# Patient Record
Sex: Male | Born: 1935 | Race: White | Hispanic: No | Marital: Married | Smoking: Current every day smoker
Health system: Southern US, Community
[De-identification: ages and names within clinical notes are randomized; demographics above are authoritative.]

## PROBLEM LIST (undated history)

## (undated) DIAGNOSIS — I6529 Occlusion and stenosis of unspecified carotid artery: Secondary | ICD-10-CM

## (undated) DIAGNOSIS — I714 Abdominal aortic aneurysm, without rupture, unspecified: Secondary | ICD-10-CM

## (undated) DIAGNOSIS — E7800 Pure hypercholesterolemia, unspecified: Secondary | ICD-10-CM

## (undated) DIAGNOSIS — I10 Essential (primary) hypertension: Secondary | ICD-10-CM

## (undated) DIAGNOSIS — K219 Gastro-esophageal reflux disease without esophagitis: Secondary | ICD-10-CM

## (undated) DIAGNOSIS — I719 Aortic aneurysm of unspecified site, without rupture: Secondary | ICD-10-CM

## (undated) HISTORY — PX: ORTHOPEDIC SURGERY: SHX850

## (undated) HISTORY — DX: Abdominal aortic aneurysm, without rupture: I71.4

## (undated) HISTORY — DX: Occlusion and stenosis of unspecified carotid artery: I65.29

## (undated) HISTORY — PX: HERNIA REPAIR: SHX51

## (undated) HISTORY — DX: Essential (primary) hypertension: I10

## (undated) HISTORY — DX: Abdominal aortic aneurysm, without rupture, unspecified: I71.40

## (undated) HISTORY — DX: Gastro-esophageal reflux disease without esophagitis: K21.9

---

## 1998-04-13 ENCOUNTER — Encounter: Payer: Self-pay | Admitting: Emergency Medicine

## 1998-04-13 ENCOUNTER — Emergency Department (HOSPITAL_COMMUNITY): Admission: EM | Admit: 1998-04-13 | Discharge: 1998-04-13 | Payer: Self-pay | Admitting: Emergency Medicine

## 1999-02-20 ENCOUNTER — Ambulatory Visit (HOSPITAL_COMMUNITY): Admission: RE | Admit: 1999-02-20 | Discharge: 1999-02-21 | Payer: Self-pay | Admitting: Urology

## 2000-06-08 ENCOUNTER — Encounter (INDEPENDENT_AMBULATORY_CARE_PROVIDER_SITE_OTHER): Payer: Self-pay

## 2000-06-08 ENCOUNTER — Other Ambulatory Visit: Admission: RE | Admit: 2000-06-08 | Discharge: 2000-06-08 | Payer: Self-pay | Admitting: Gastroenterology

## 2000-06-13 ENCOUNTER — Ambulatory Visit (HOSPITAL_COMMUNITY): Admission: RE | Admit: 2000-06-13 | Discharge: 2000-06-13 | Payer: Self-pay | Admitting: Gastroenterology

## 2000-06-13 ENCOUNTER — Encounter: Payer: Self-pay | Admitting: Gastroenterology

## 2000-09-13 ENCOUNTER — Emergency Department (HOSPITAL_COMMUNITY): Admission: EM | Admit: 2000-09-13 | Discharge: 2000-09-13 | Payer: Self-pay | Admitting: *Deleted

## 2000-09-13 ENCOUNTER — Encounter: Payer: Self-pay | Admitting: Neurology

## 2000-09-13 ENCOUNTER — Ambulatory Visit (HOSPITAL_COMMUNITY): Admission: RE | Admit: 2000-09-13 | Discharge: 2000-09-13 | Payer: Self-pay | Admitting: Neurology

## 2001-03-03 ENCOUNTER — Ambulatory Visit (HOSPITAL_COMMUNITY): Admission: RE | Admit: 2001-03-03 | Discharge: 2001-03-03 | Payer: Self-pay | Admitting: Family Medicine

## 2001-03-03 ENCOUNTER — Encounter: Payer: Self-pay | Admitting: Family Medicine

## 2003-10-25 ENCOUNTER — Inpatient Hospital Stay (HOSPITAL_COMMUNITY): Admission: EM | Admit: 2003-10-25 | Discharge: 2003-10-27 | Payer: Self-pay | Admitting: Emergency Medicine

## 2003-10-27 ENCOUNTER — Encounter: Payer: Self-pay | Admitting: Internal Medicine

## 2003-12-19 ENCOUNTER — Ambulatory Visit (HOSPITAL_COMMUNITY): Admission: RE | Admit: 2003-12-19 | Discharge: 2003-12-19 | Payer: Self-pay | Admitting: Gastroenterology

## 2004-03-23 ENCOUNTER — Ambulatory Visit (HOSPITAL_COMMUNITY): Admission: RE | Admit: 2004-03-23 | Discharge: 2004-03-23 | Payer: Self-pay | Admitting: Family Medicine

## 2004-06-15 ENCOUNTER — Ambulatory Visit (HOSPITAL_COMMUNITY): Admission: RE | Admit: 2004-06-15 | Discharge: 2004-06-15 | Payer: Self-pay | Admitting: Family Medicine

## 2006-09-24 ENCOUNTER — Encounter: Admission: RE | Admit: 2006-09-24 | Discharge: 2006-09-24 | Payer: Self-pay | Admitting: Family Medicine

## 2008-01-31 ENCOUNTER — Encounter: Admission: RE | Admit: 2008-01-31 | Discharge: 2008-01-31 | Payer: Self-pay | Admitting: General Surgery

## 2008-02-01 ENCOUNTER — Ambulatory Visit (HOSPITAL_BASED_OUTPATIENT_CLINIC_OR_DEPARTMENT_OTHER): Admission: RE | Admit: 2008-02-01 | Discharge: 2008-02-01 | Payer: Self-pay | Admitting: General Surgery

## 2009-01-02 ENCOUNTER — Encounter: Admission: RE | Admit: 2009-01-02 | Discharge: 2009-01-02 | Payer: Self-pay | Admitting: Family Medicine

## 2009-04-20 ENCOUNTER — Encounter: Admission: RE | Admit: 2009-04-20 | Discharge: 2009-04-20 | Payer: Self-pay | Admitting: Orthopedic Surgery

## 2010-06-02 ENCOUNTER — Other Ambulatory Visit: Payer: Self-pay | Admitting: Dermatology

## 2010-09-29 NOTE — Op Note (Signed)
NAMEJULIS, [name]                 ACCOUNT NO.:  [id number]   MEDICAL RECORD NO.:  [id number]          PATIENT TYPE:  AMB   LOCATION:  DSC                          FACILITY:  [facility]   PHYSICIAN:  Adolph Pollack, M.D.DATE OF BIRTH:  August 28, 1933   DATE OF PROCEDURE:  02/01/2008  DATE OF DISCHARGE:                               OPERATIVE REPORT   PREOPERATIVE DIAGNOSIS:  Right inguinal hernia.   POSTOPERATIVE DIAGNOSIS:  Direct right inguinal hernia.   PROCEDURE:  Right inguinal hernia repair with mesh.   SURGEON:  Adolph Pollack, MD   ASSISTANT:  Tomma Rakers, PA student   ANESTHESIA:  General/LMA with Marcaine local.   INDICATIONS:  This is a 77 year old male who has been developing some  groin pain on the right side.  He has been known to have a right  inguinal hernia for a couple years but it has become bothering him more  and more and thus he presents for repair.  The procedure and risks and  aftercare were discussed with him preoperatively.   TECHNIQUE:  He was seen in the holding area and the right groin marked  with my initials.  He was brought to the operating room and placed  supine on the operating table and a general anesthetic was administered  by way of LMA.  The hair in the right groin was clipped and the right  groin area was sterilely prepped and draped.  Local anesthetic was  infiltrated superficially and deep in the right groin.  A right groin  incision was made through the skin, subcutaneous tissue, and Scarpa  fascia exposing the external oblique aponeurosis.  Local anesthetic was  infiltrated deep to the external oblique aponeurosis.  The external  oblique aponeurosis was then incised through the external ring medially  and towards the anterior superior iliac spine.  Using blunt dissection,  I exposed the shelving edge of the inguinal ligament inferiorly and the  internal oblique muscle aponeurosis superiorly.  The ilioinguinal nerve  was identified and  a perineural block was performed.  The spermatic cord  was then isolated and a window created around using blunt dissection and  I included the ilioinguinal nerve with this.  I noted that there was no  indirect sac, but there was a direct hernia that I was able to reduce.  Following this, a piece of 3 x 6-inch polypropylene mesh was brought  into the field and anchored 2 cm medial to the pubic tubercle.  This was  done with a 2-0 Prolene suture.  The inferior aspect of mesh was then  anchored to the shelving edge of the inguinal ligament with a running 2-  0 Prolene suture upto the level 1-2 cm lateral to the internal ring.  A  slit was cut into the mesh creating 2 tails which were wrapped around  the cord.  The superior aspect of the mesh was anchored to the internal  oblique aponeurosis with interrupted 2-0 Vicryl sutures.  Following  this, the 2 tails of the mesh were crossed creating a new internal ring  and they  were then anchored to the shelving edge of the inguinal  ligament with a 2-0 Prolene suture.  The tip of a hemostat was able to  be placed through the new aperture.   At this point, hemostasis was adequate.  The lateral aspect of mesh was  then tucked deep to the external oblique aponeurosis.  The external  oblique aponeurosis was closed over the mesh and cord with a running 3-0  Vicryl suture.  Subcutaneous tissue was closed with a running 2-0 Vicryl  suture.  The skin was closed with running 3-0 Monocryl subcuticular  stitch.  Steri-Strips and sterile dressings were applied.   He tolerated the procedure well without any apparent complications and  was taken to recovery room in satisfactory condition.      Adolph Pollack, M.D.  Electronically Signed     TJR/MEDQ  D:  02/01/2008  T:  02/02/2008  Job:  [id number]   cc:   Dario Guardian, M.D.

## 2010-10-02 NOTE — Op Note (Signed)
NAME:  [name], [name]                           ACCOUNT NO.:  [id number]   MEDICAL RECORD NO.:  [id number]                   PATIENT TYPE:  AMB   LOCATION:  ENDO                                 FACILITY:  [facility]   PHYSICIAN:  James L. Malon Kindle., M.D.          DATE OF BIRTH:  1934/02/12   DATE OF PROCEDURE:  12/19/2003  DATE OF DISCHARGE:                                 OPERATIVE REPORT   PROCEDURE:  Esophagogastroduodenoscopy.   MEDICATIONS GIVEN:  Cetacaine spray, Fentanyl 50 mcg, Versed 5 mg IV.   INDICATIONS FOR PROCEDURE:  The patient has had esophageal reflux symptoms  that have been refractory to Prilosec.   DESCRIPTION OF PROCEDURE:  The procedure was explained to the patient and  consent obtained.  With the patient in the left lateral decubitus position,  the Olympus scope was inserted and advanced.  The stomach was entered, the  pylorus identified and passed.  The duodenum including the bulb and second  duodenum were seen well and were unremarkable.  The scope was withdrawn back  into the stomach.  The antrum and body were normal, no ulceration or  inflammation.  The fundus and cardia were seen on the retroflex view and  were normal.  The diaphragmatic hiatus was located at 44 cm.  There was a 5  cm hiatal hernia with a widely patent GE junction at 39 cm with free reflux.  There was no Barrett's esophagitis, clear demarcation of the Z-line.  The  proximal esophagus was seen well and was normal.  The scope was withdrawn.  The patient tolerated the procedure well.   ASSESSMENT:  Large hiatal hernia with reflux, 530.81.   PLAN:  Will give her a reflux instruction sheet, increase her Prilosec to  b.i.d., add Reglan, see back in the office in approximately 6-8 weeks.                                               James L. Malon Kindle., M.D.    Waldron Session  D:  12/19/2003  T:  12/19/2003  Job:  [id number]   cc:   Molly Maduro A. Nicholos Johns, M.D.  [address]  [place]  [place] [address]  Fax: [phone]

## 2010-10-02 NOTE — Discharge Summary (Signed)
NAME:  Justin, Herman                           ACCOUNT NO.:  [id number]   MEDICAL RECORD NO.:  [id number]                   PATIENT TYPE:  INP   LOCATION:  0347                                 FACILITY:  [facility]   PHYSICIAN:  Sherin Quarry, MD                   DATE OF BIRTH:  [birth year]   DATE OF ADMISSION:  10/25/2003  DATE OF DISCHARGE:  10/27/2003                                 DISCHARGE SUMMARY   [name] is a 77 year old man with a longstanding history of acid reflux  and chronic cigarette smoking who presented to the emergency room on October 25, 2003, after experiencing acute onset of severe substernal chest  discomfort which awakened him from sleep. This was not associated with  exertion. There was no associated shortness of breath, palpitations, or  diaphoresis. The patient drove himself to the emergency room and in the  emergency room he was given sublingual nitroglycerin which seemed to provide  relatively rapid relief of his discomfort.   Physical exam at the time of admission was described by Dr. Nehemiah Settle with a  blood pressure of 121/61, pulse 50, respirations 16, temperature 97. HEENT  exam was within normal limits. The chest was clear to auscultation and  percussion. Cardiovascular exam revealed normal S1 and S2 without murmurs,  rubs, or gallops. Abdomen was benign. Neurologic testing and examination of  the extremities was normal.   Relevant laboratory studies included serial cardiac enzymes which were  negative. Liver functions were within normal limits. CMET was remarkable for  potassium of 3.2, hemoglobin 14.0, hematocrit 41. Lipid profile revealed an  LDL cholesterol of 96, HDL 31, triglycerides 229. EKG was remarkable only  for sinus bradycardia.   The patient was admitted and begun on standard rule out MI protocol. He was  seen in consultation by Dr. Amil Amen of the cardiology service and a  Cardiolite stress test was arranged. Prior to this study the patient  was  placed on Lovenox on a therapeutic protocol. On October 27, 2003, the patient  underwent an adenosine Cardiolite study and this was normal. The patient  achieved 90% of maximum heart rate, no symptoms occurred, and the ejection  fraction was estimated to be 58%. It was Dr. Amil Amen' conclusion that the  patient had noncardiac chest pain. Therefore, on October 27, 2003, the patient  was discharged.   DISCHARGE MEDICATIONS:  Prilosec 20 mg daily. The patient is encouraged to  take this medication every day. He will also continue 81 mg of aspirin  daily.   He was advised it was very important for him to discontinue cigarette  smoking. He will follow up with Dr. Nicholos Johns.   CONDITION ON DISCHARGE:  Good.  Sherin Quarry, MD    SY/MEDQ  D:  10/27/2003  T:  10/27/2003  Job:  [id number]   cc:   Elana Alm. Nicholos Johns, M.D.  [address]  [place]  [place] [address]  Fax: [phone]   Francisca December, M.D.  [address]  [address]  [place]  [place] [address]  Fax: [phone]

## 2010-10-02 NOTE — Consult Note (Signed)
NAME:  [name], MEXICANO                           ACCOUNT NO.:  [id number]   MEDICAL RECORD NO.:  [id number]                   PATIENT TYPE:  INP   LOCATION:  0102                                 FACILITY:  [facility]   PHYSICIAN:  Lesleigh Noe, M.D.            DATE OF BIRTH:  11/04/1933   DATE OF CONSULTATION:  10/25/2003  DATE OF DISCHARGE:                                   CONSULTATION   REASON FOR CONSULTATION:  Chest discomfort.   CONCLUSIONS:  1. Chest heaviness relieved by sublingual nitroglycerin.  Discomfort     awakened the patient from sleep.  Rule out coronary artery disease.  2. Gastroesophageal reflux disease with recent increase in symptoms,     improved by a proton pump inhibitor.  3. Positive risk factors for coronary disease including tobacco use and high     cholesterol.   RECOMMENDATIONS:  1. Stress Cardiolite this weekend.  2. Aspirin 81 mg per day.  3. Nitroglycerin for recurrent pain.  4. Serial enzymes and EKG's to rule out myocardial infarction.   COMMENTS:  The patient is 26 and is a difficult person to get a history  from.  For several months, he has been troubled by increasing  gastroesophageal reflux and recently had increase in therapy with proton  pump inhibitors that seemed to improve reflux symptoms.  He had been  scheduled to have a stress test with Dr. Mayford Knife on October 31, 2003.  He came  to the emergency room this morning because he was awakened with a sensation  of mild heaviness in the lower precordial region with some radiation into  the left parasternal region.  Finally, in the emergency room sublingual  nitroglycerin was given, and this seemed to help relieve the discomfort  completely.  He is currently pain free.  He does not know the total duration  of the discomfort.  He has never before had this type of discomfort before.   ALLERGIES:  CODEINE causes nausea.   MEDICATIONS:  1. Prilosec.  2. Aspirin.   SIGNIFICANT PAST MEDICAL  PROBLEMS:  1. Tobacco use.  2. Hyperlipidemia.   FAMILY HISTORY:  Mother had a pacemaker.   PHYSICAL EXAMINATION:  GENERAL:  The patient is in no acute distress.  VITAL SIGNS:  His blood pressure is 128/70, heart rate is 55.  No  arrhythmias are noted on monitor.  LUNGS:  Clear.  CARDIAC:  No gallop, no click, no rub.  ABDOMEN:  Soft.  EXTREMITIES:  No edema.   EKG revealed sinus bradycardia, and is otherwise normal.  There is no change  when compared to prior tracings on the chart from 2000 and 2002.  The  patient's potassium is 3.2, creatinine 1.1.  Three sets of point of care  cardiac __________ markers are negative for evidence of infarction.  Lipid  enzymes are unremarkable.  I have not seen a chest x-ray.  DISCUSSION:  Dr. Amil Amen will see the patient this weekend.  He should have  serial enzymes and EKG's to rule out myocardial infarction.  He is scheduled  to have a Cardiolite myocardial perfusion study performed on Sunday morning  at [facility].  NPO after midnight Saturday night.                                               Lesleigh Noe, M.D.    HWS/MEDQ  D:  10/25/2003  T:  10/25/2003  Job:  [id number]   cc:   Fulton Mole, M.D.

## 2010-10-02 NOTE — H&P (Signed)
NAME:  [name], [name]                           ACCOUNT NO.:  [id number]   MEDICAL RECORD NO.:  [id number]                   PATIENT TYPE:  EMS   LOCATION:  ED                                   FACILITY:  [facility]   PHYSICIAN:  Deirdre Peer. Polite, M.D.              DATE OF BIRTH:  [birth year]   DATE OF ADMISSION:  10/25/2003  DATE OF DISCHARGE:                                HISTORY & PHYSICAL   CHIEF COMPLAINT:  Chest pain.   HISTORY OF PRESENT ILLNESS:  [name] is a 77 year old white male with a  past medical history of GERD, borderline high cholesterol and tobacco abuse  who presents to the ED for evaluation after being awake with substernal  chest pain.  The patient states that the pain was essentially 8/10 with mild  radiation towards the left side of his chest.  He denied any shortness of  breath, palpitations or diaphoresis. The patient stated that pain last until  coming to the ED which he drove himself. It took approximately 15 minutes to  get to the ED.  In the ED, the patient was triaged and was given sublingual  nitroglycerin and the patient stated that his pain was relieved. In the ED,  the patient had an EKG and point of care enzyme EKG was without acute change  and point of care enzymes were within normal limits.  Of note, the patient  does suffer from GERD and saw his primary M.D. last week for the same. At  that time, his primary M.D. had concerns for coronary artery disease and at  that time a stress test was ordered. Because of the patient's complaint of  chest pain that relieved with nitroglycerin, admission is deemed necessary  for further evaluation and treatment.   PAST MEDICAL HISTORY:  As stated above.   MEDICATIONS ON ADMISSION:  Prilosec, 81 mg aspirin.   SOCIAL HISTORY:  Significant for tobacco one pack per day greater than 50  years, social alcohol, no drugs.   PAST SURGICAL HISTORY:  Negative.   ALLERGIES:  CODEINE causes confusion.   FAMILY HISTORY:   Father deceased with liver disease secondary to ETOH abuse.  Mother is pacer dependent, denies any MI, hypertension or CVA.  The patient  has three brothers and one sister that are healthy.   REVIEW OF SYMPTOMS:  The patient denies any fever or chills, no nausea or  vomiting, no diarrhea and no constipation, no orthopnea, no PND, no leg  edema.  The patient states he is able to ambulate greater than 1/2 mile  without dyspnea on exertion.   PHYSICAL EXAMINATION:  GENERAL:  The patient was alert and oriented x3 in no  apparent distress.  HEENT:  Within normal limits.  CHEST:  Clear.  CARDIOVASCULAR:  Regular, S1, S2, no S3.  ABDOMEN:  Soft, nontender.  EXTREMITIES:  2+ pulse and no edema.   LABORATORY  DATA:  BMET, sodium 137, potassium 3.2, chloride 108, carbon  dioxide 25, BUN 13, creatinine 1.1, AST and ALT 20 and 11 respectively.  Point of care enzyme __________ 112.  CK-MB less than 1.0, troponin I less  than 0.05.   EKG, sinus bradycardia rate of 55 with early repolarization compared with  old EKG.  There does not appear to be any acute changes.   ASSESSMENT/PLAN:  1. Chest pain with relief with nitroglycerin.  2. Gastroesophageal reflux disease.  3. High cholesterol.  4. Tobacco abuse.   RECOMMENDATIONS:  The patient be admitted to a telemetry floor bed for  evaluation of acute coronary __________.  Will obtain serial cardiac  enzymes, continue aspirin, Protonix, Lovenox and will not institute beta  blocker. If the patient has underlying bradycardia will also recheck an EKG  and check followup lipids.  Also recommend cardiac evaluation for risk  stratification. Will make further recommendations after review of the above  studies.                                               Deirdre Peer. Polite, M.D.    RDP/MEDQ  D:  10/25/2003  T:  10/25/2003  Job:  [id number]   cc:   [name], M.D.  [address]  [place]  [place] [address]  Fax: [phone]

## 2011-02-15 LAB — COMPREHENSIVE METABOLIC PANEL
BUN: 15
CO2: 29
Chloride: 104
Creatinine, Ser: 1.13
GFR calc non Af Amer: >60
Total Bilirubin: 1

## 2011-02-15 LAB — DIFFERENTIAL
Basophils Absolute: 0
Eosinophils Relative: 3
Lymphocytes Relative: 30
Neutro Abs: 3.8
Neutrophils Relative %: 60

## 2011-02-15 LAB — CBC
HCT: 44
MCV: 84.8
RBC: 5.19
WBC: 6.4

## 2012-07-02 ENCOUNTER — Emergency Department (HOSPITAL_COMMUNITY)
Admission: EM | Admit: 2012-07-02 | Discharge: 2012-07-03 | Disposition: A | Discharge status: Home / Self Care | Payer: Medicare Other | Attending: Emergency Medicine | Admitting: Emergency Medicine

## 2012-07-02 ENCOUNTER — Encounter (HOSPITAL_COMMUNITY): Payer: Self-pay | Admitting: *Deleted

## 2012-07-02 ENCOUNTER — Emergency Department (HOSPITAL_COMMUNITY): Payer: Medicare Other

## 2012-07-02 DIAGNOSIS — E7800 Pure hypercholesterolemia, unspecified: Secondary | ICD-10-CM | POA: Insufficient documentation

## 2012-07-02 DIAGNOSIS — R1033 Periumbilical pain: Secondary | ICD-10-CM | POA: Insufficient documentation

## 2012-07-02 DIAGNOSIS — K219 Gastro-esophageal reflux disease without esophagitis: Secondary | ICD-10-CM | POA: Insufficient documentation

## 2012-07-02 DIAGNOSIS — Z8679 Personal history of other diseases of the circulatory system: Secondary | ICD-10-CM | POA: Insufficient documentation

## 2012-07-02 DIAGNOSIS — R109 Unspecified abdominal pain: Secondary | ICD-10-CM

## 2012-07-02 DIAGNOSIS — Z79899 Other long term (current) drug therapy: Secondary | ICD-10-CM | POA: Insufficient documentation

## 2012-07-02 DIAGNOSIS — Z7982 Long term (current) use of aspirin: Secondary | ICD-10-CM | POA: Insufficient documentation

## 2012-07-02 DIAGNOSIS — F17200 Nicotine dependence, unspecified, uncomplicated: Secondary | ICD-10-CM | POA: Insufficient documentation

## 2012-07-02 DIAGNOSIS — Z9889 Other specified postprocedural states: Secondary | ICD-10-CM | POA: Insufficient documentation

## 2012-07-02 HISTORY — DX: Aortic aneurysm of unspecified site, without rupture: I71.9

## 2012-07-02 HISTORY — DX: Pure hypercholesterolemia, unspecified: E78.00

## 2012-07-02 LAB — COMPREHENSIVE METABOLIC PANEL
ALT: 8 U/L (ref 0–53)
AST: 15 U/L (ref 0–37)
Albumin: 3.5 g/dL (ref 3.5–5.2)
Alkaline Phosphatase: 86 U/L (ref 39–117)
BUN: 19 mg/dL (ref 6–23)
CO2: 29 mEq/L (ref 19–32)
Calcium: 8.9 mg/dL (ref 8.4–10.5)
Chloride: 103 mEq/L (ref 96–112)
Creatinine, Ser: 1.3 mg/dL (ref 0.50–1.35)
GFR calc Af Amer: 59 mL/min — ABNORMAL LOW (ref >90)
GFR calc non Af Amer: 51 mL/min — ABNORMAL LOW (ref >90)
Glucose, Bld: 116 mg/dL — ABNORMAL HIGH (ref 70–99)
Potassium: 3.4 mEq/L — ABNORMAL LOW (ref 3.5–5.1)
Sodium: 140 mEq/L (ref 135–145)
Total Bilirubin: 0.4 mg/dL (ref 0.3–1.2)
Total Protein: 6.8 g/dL (ref 6.0–8.3)

## 2012-07-02 LAB — URINALYSIS, MICROSCOPIC ONLY
Bilirubin Urine: NEGATIVE
Glucose, UA: NEGATIVE mg/dL
Hgb urine dipstick: NEGATIVE
Ketones, ur: NEGATIVE mg/dL
Leukocytes, UA: NEGATIVE
Nitrite: NEGATIVE
Protein, ur: NEGATIVE mg/dL
Specific Gravity, Urine: 1.024 (ref 1.005–1.030)
Urobilinogen, UA: 1 mg/dL (ref 0.0–1.0)
pH: 6 (ref 5.0–8.0)

## 2012-07-02 LAB — CBC WITH DIFFERENTIAL/PLATELET
Basophils Absolute: 0 10*3/uL (ref 0.0–0.1)
Basophils Relative: 1 % (ref 0–1)
Eosinophils Absolute: 0.2 10*3/uL (ref 0.0–0.7)
Eosinophils Relative: 2 % (ref 0–5)
HCT: 42 % (ref 39.0–52.0)
Hemoglobin: 14.2 g/dL (ref 13.0–17.0)
Lymphocytes Relative: 24 % (ref 12–46)
Lymphs Abs: 2.1 10*3/uL (ref 0.7–4.0)
MCH: 28.7 pg (ref 26.0–34.0)
MCHC: 33.8 g/dL (ref 30.0–36.0)
MCV: 84.8 fL (ref 78.0–100.0)
Monocytes Absolute: 0.6 10*3/uL (ref 0.1–1.0)
Monocytes Relative: 7 % (ref 3–12)
Neutro Abs: 5.7 10*3/uL (ref 1.7–7.7)
Neutrophils Relative %: 66 % (ref 43–77)
Platelets: 162 10*3/uL (ref 150–400)
RBC: 4.95 MIL/uL (ref 4.22–5.81)
RDW: 13.3 % (ref 11.5–15.5)
WBC: 8.6 10*3/uL (ref 4.0–10.5)

## 2012-07-02 LAB — LIPASE, BLOOD: Lipase: 21 U/L (ref 11–59)

## 2012-07-02 MED ORDER — HYDROMORPHONE HCL PF 1 MG/ML IJ SOLN
1.0000 mg | Freq: Once | INTRAMUSCULAR | Status: AC
  Administered 2012-07-02: 1 mg via INTRAVENOUS
  Filled 2012-07-02: qty 1

## 2012-07-02 MED ORDER — ONDANSETRON HCL 4 MG/2ML IJ SOLN
4.0000 mg | Freq: Once | INTRAMUSCULAR | Status: AC
  Administered 2012-07-02: 4 mg via INTRAVENOUS

## 2012-07-02 MED ORDER — ONDANSETRON HCL 4 MG/2ML IJ SOLN
INTRAMUSCULAR | Status: AC
  Filled 2012-07-02: qty 2

## 2012-07-02 MED ORDER — IOHEXOL 300 MG/ML  SOLN
50.0000 mL | Freq: Once | INTRAMUSCULAR | Status: AC | PRN
  Administered 2012-07-02: 50 mL via ORAL

## 2012-07-02 MED ORDER — SODIUM CHLORIDE 0.9 % IV BOLUS (SEPSIS)
1000.0000 mL | Freq: Once | INTRAVENOUS | Status: AC
  Administered 2012-07-02: 1000 mL via INTRAVENOUS

## 2012-07-02 MED ORDER — ONDANSETRON HCL 4 MG/2ML IJ SOLN
4.0000 mg | Freq: Once | INTRAMUSCULAR | Status: AC
  Administered 2012-07-02: 4 mg via INTRAVENOUS
  Filled 2012-07-02: qty 2

## 2012-07-02 NOTE — ED Notes (Signed)
Pt states abdominal pain, mid and periumbilical. Pt has hx of AAA 3.1 cm last measured x 10 days ago. Pt denies n/v/d. Pt states pain is constant and worsening. Pt states he is unable to sleepy normally because pain worsens when he is on his side. Pt denies pain radiation.

## 2012-07-02 NOTE — ED Provider Notes (Signed)
History    79 year old male with abdominal pain. Gradual onset 2 days ago. Initially intermittent but has been more or less constant for the past day. Does not radiate. His history reflux, but says current symptoms feel different. No radiation. No fevers or chills. No nausea or vomiting. No urinary complaints. No diarrhea. No history of abdominal surgery. Known abdominal aortic aneurysm, but this was only 3.1 cm on evaluation less than 2 weeks ago. No significant alcohol use.    CSN: [id number]  Arrival date & time 07/02/12  1941   First MD Initiated Contact with Patient 07/02/12 2151      Chief Complaint  Patient presents with  . Abdominal Pain    (Consider location/radiation/quality/duration/timing/severity/associated sxs/prior treatment) HPI  Past Medical History  Diagnosis Date  . Aortic aneurysm   . Hypercholesteremia     Past Surgical History  Procedure Laterality Date  . Orthopedic surgery Left     wrist  . Hernia repair      History reviewed. No pertinent family history.  History  Substance Use Topics  . Smoking status: Current Every Day Smoker    Types: Cigarettes  . Smokeless tobacco: Not on file  . Alcohol Use: No      Review of Systems  All systems reviewed and negative, other than as noted in HPI.   Allergies  Codeine  Home Medications   Current Outpatient Rx  Name  Route  Sig  Dispense  Refill  . aspirin EC 81 MG tablet   Oral   Take 81 mg by mouth daily.         Marland Kitchen omeprazole (PRILOSEC) 20 MG capsule   Oral   Take 20 mg by mouth daily.         . simvastatin (ZOCOR) 20 MG tablet   Oral   Take 20 mg by mouth every evening.           BP 133/77  Pulse 84  Temp(Src) 97.8 F (36.6 C) (Oral)  Resp 17  Ht 5\' 9"  (1.753 m)  Wt 157 lb (71.215 kg)  BMI 23.17 kg/m2  SpO2 100%  Physical Exam  Nursing note and vitals reviewed. Constitutional: He appears well-developed and well-nourished. No distress.  HENT:  Head: Normocephalic  and atraumatic.  Eyes: Conjunctivae are normal. Right eye exhibits no discharge. Left eye exhibits no discharge.  Neck: Neck supple.  Cardiovascular: Normal rate, regular rhythm and normal heart sounds.  Exam reveals no gallop and no friction rub.   No murmur heard. Pulmonary/Chest: Effort normal and breath sounds normal. No respiratory distress.  Abdominal: Soft. He exhibits no distension. There is tenderness.  Periumbilical tenderness without rebound or guarding. No distention.  Musculoskeletal: He exhibits no edema and no tenderness.  Neurological: He is alert.  Skin: Skin is warm and dry.  Psychiatric: He has a normal mood and affect. His behavior is normal. Thought content normal.    ED Course  Procedures (including critical care time)  Labs Reviewed  COMPREHENSIVE METABOLIC PANEL - Abnormal; Notable for the following:    Potassium 3.4 (*)    Glucose, Bld 116 (*)    GFR calc non Af Amer 51 (*)    GFR calc Af Amer 59 (*)    All other components within normal limits  CBC WITH DIFFERENTIAL  LIPASE, BLOOD  URINALYSIS, MICROSCOPIC ONLY   Ct Abdomen Pelvis W Contrast  07/03/2012  *RADIOLOGY REPORT*  Clinical Data: Mid abdominal pain, history of AAA  CT ABDOMEN AND PELVIS  WITH CONTRAST  Technique:  Multidetector CT imaging of the abdomen and pelvis was performed following the standard protocol during bolus administration of intravenous contrast.  Contrast: OMNIPAQUE IOHEXOL 300 MG/ML  SOLN  Comparison: Lumbar spine MRI 04/20/2009  Findings:  Lower Chest: The lung bases are clear.  No suspicious adenopathy or focal consolidation.  Visualized cardiac structures within normal limits.  No pericardial effusion.  Patulous distal thoracic esophagus with probable small hiatal hernia.  Abdomen: Mild distension of the stomach with ingested oral contrast material.  Nonspecific circumferential wall thickening in the duodenal bulb.  The remainder the duodenum is unremarkable save for a  periampullary diverticulum.  Unremarkable CT appearance of the spleen, adrenal glands, pancreas.  Scattered punctate calcifications throughout the hepatic parenchyma suggest prior granulomatous disease.  No focal hepatic lesion. Gallbladder is unremarkable. No intra or extrahepatic biliary ductal dilatation.  Hypoattenuating lesions throughout both kidneys, the majority of which are too small for accurate characterization.  The largest lesion measures 2 cm in the lower pole of the right kidney and is consistent with a simple cyst.  No hydronephrosis.  Normal-caliber large and small bowel throughout the abdomen.  No evidence of bowel obstruction.  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  Normal appendix identified in the right lower quadrant.  No free fluid or suspicious adenopathy.  Pelvis: Prostatomegaly.  The prostate measures 5.2 cm transversely. Unremarkable CT appearance of the bladder and seminal vesicles.  No free fluid or suspicious adenopathy.  Bones: No acute fracture or aggressive appearing lytic or blastic osseous lesion.  Vascular: Scattered atherosclerotic vascular calcifications throughout the aorta.  The distal infrarenal abdominal aorta is ectatic measuring up to 2.9 cm in diameter. Nonspecific haziness and thickening of the wall of the proximal SMA. The vascular lumen remains patent without evidence of narrowing, or dissection.  The more distal vessel and its branches are patent without significant wall thickening.  IMPRESSION:  1.  Nonspecific focal thickening of the wall of the proximal superior mesenteric artery with haziness in the periarterial fat. The vessel lumen remains patent without narrowing or dissection. Findings raise concern for potential underlying vasculitis/arteritis.  Recommend clinical correlation with serum CRP and ESR.  2. Mild circumferential thickening of the duodenal bulb.  Query possible duodenitis.  3.  Atherosclerotic vascular  disease with ectasia of the infrarenal abdominal aorta to 2.9 cm.  Recommend follow-up screening ultrasound of the abdominal aorta in 3 - 5 years.  4.  Sequela of prior granulomatous disease involving the liver.  5.  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  6.  Prostatomegaly.  These results were called by telephone on 05/02/2013 at 12:45 a.m. to Dr. Juleen China, who verbally acknowledged these results.   Original Report Authenticated By: Malachy Moan, M.D.      1. Abdominal pain       MDM  79yM with abdominal pain. Periumbilical tenderness, but not peritoneal. Labs unremarkable. Given age, will CT.   CT with significant for possible SMA arteritis. I discussed case with vascular surgery. Suspect that this is not real. Recommend checking sed rate, crp, ana. Do not recommend starting patient on steroids based on CT alone, but would if there is laboratory evidence supporting it as well. CT fairly unremarkable otherwise aside from questionable duodenitis. Assuming pt's symptoms adequately controlled, I feel that pt can be discharged. Course of steroids and vascular surgery follow-up depending on labs.   1:24 AM Pt and family updated on plan. Transfer of  care to Dr. Patria Mane.         Raeford Razor, MD 07/03/12 [phone]

## 2012-07-03 LAB — C-REACTIVE PROTEIN: CRP: 1 mg/dL — ABNORMAL HIGH (ref <0.60)

## 2012-07-03 LAB — SEDIMENTATION RATE: Sed Rate: 21 mm/hr — ABNORMAL HIGH (ref 0–16)

## 2012-07-03 MED ORDER — ONDANSETRON 8 MG PO TBDP: 8.0000 mg | ORAL_TABLET | Freq: Three times a day (TID) | ORAL | Status: DC | PRN

## 2012-07-03 MED ORDER — IOHEXOL 300 MG/ML  SOLN
100.0000 mL | Freq: Once | INTRAMUSCULAR | Status: AC | PRN
  Administered 2012-07-03: 100 mL via INTRAVENOUS

## 2012-07-03 MED ORDER — PROMETHAZINE HCL 25 MG/ML IJ SOLN
12.5000 mg | Freq: Once | INTRAMUSCULAR | Status: AC
  Administered 2012-07-03: 12.5 mg via INTRAVENOUS
  Filled 2012-07-03: qty 1

## 2012-07-03 MED ORDER — PREDNISONE 10 MG PO TABS: 60.0000 mg | ORAL_TABLET | Freq: Every day | ORAL | Status: DC

## 2012-07-03 MED ORDER — HYDROCODONE-ACETAMINOPHEN 5-325 MG PO TABS: 1.0000 | ORAL_TABLET | ORAL | Status: DC | PRN

## 2012-07-03 NOTE — ED Provider Notes (Signed)
The case was discussed with vascular surgery Dr. Imogene Burn by Dr. Juleen China who recommended treatment with steroids for the possibility of SMA arteritis his sedimentation rate/NA/CRP was elevated.  He is a very mild elevation in sedimentation rate.  This is very nonspecific.  However given these findings are as the patient followup with the vascular surgeon and to start him on prednisone.  Close PCP followup.  3:01 AM The patient feels much better at this time.  His nausea and vomiting are controlled.  His pain is resolved.  Repeat abdominal exam demonstrates no peritonitis on exam.  Vital signs normal.  Discharge home in good condition.  He understands to return to the ER for new or worsening symptoms.  Close PCP and vascular surgery followup.  I personally reviewed the imaging tests through PACS system I reviewed available ER/hospitalization records through the EMR  The encounter diagnosis was Abdominal pain.  Ct Abdomen Pelvis W Contrast  07/03/2012  *RADIOLOGY REPORT*  Clinical Data: Mid abdominal pain, history of AAA  CT ABDOMEN AND PELVIS WITH CONTRAST  Technique:  Multidetector CT imaging of the abdomen and pelvis was performed following the standard protocol during bolus administration of intravenous contrast.  Contrast: OMNIPAQUE IOHEXOL 300 MG/ML  SOLN  Comparison: Lumbar spine MRI 04/20/2009  Findings:  Lower Chest: The lung bases are clear.  No suspicious adenopathy or focal consolidation.  Visualized cardiac structures within normal limits.  No pericardial effusion.  Patulous distal thoracic esophagus with probable small hiatal hernia.  Abdomen: Mild distension of the stomach with ingested oral contrast material.  Nonspecific circumferential wall thickening in the duodenal bulb.  The remainder the duodenum is unremarkable save for a periampullary diverticulum.  Unremarkable CT appearance of the spleen, adrenal glands, pancreas.  Scattered punctate calcifications throughout the hepatic parenchyma  suggest prior granulomatous disease.  No focal hepatic lesion. Gallbladder is unremarkable. No intra or extrahepatic biliary ductal dilatation.  Hypoattenuating lesions throughout both kidneys, the majority of which are too small for accurate characterization.  The largest lesion measures 2 cm in the lower pole of the right kidney and is consistent with a simple cyst.  No hydronephrosis.  Normal-caliber large and small bowel throughout the abdomen.  No evidence of bowel obstruction.  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  Normal appendix identified in the right lower quadrant.  No free fluid or suspicious adenopathy.  Pelvis: Prostatomegaly.  The prostate measures 5.2 cm transversely. Unremarkable CT appearance of the bladder and seminal vesicles.  No free fluid or suspicious adenopathy.  Bones: No acute fracture or aggressive appearing lytic or blastic osseous lesion.  Vascular: Scattered atherosclerotic vascular calcifications throughout the aorta.  The distal infrarenal abdominal aorta is ectatic measuring up to 2.9 cm in diameter. Nonspecific haziness and thickening of the wall of the proximal SMA. The vascular lumen remains patent without evidence of narrowing, or dissection.  The more distal vessel and its branches are patent without significant wall thickening.  IMPRESSION:  1.  Nonspecific focal thickening of the wall of the proximal superior mesenteric artery with haziness in the periarterial fat. The vessel lumen remains patent without narrowing or dissection. Findings raise concern for potential underlying vasculitis/arteritis.  Recommend clinical correlation with serum CRP and ESR.  2. Mild circumferential thickening of the duodenal bulb.  Query possible duodenitis.  3.  Atherosclerotic vascular disease with ectasia of the infrarenal abdominal aorta to 2.9 cm.  Recommend follow-up screening ultrasound of the abdominal aorta in 3 - 5 years.  4.  Sequela of prior  granulomatous disease involving the liver.  5.  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  6.  Prostatomegaly.  These results were called by telephone on 05/02/2013 at 12:45 a.m. to Dr. Juleen China, who verbally acknowledged these results.   Original Report Authenticated By: Malachy Moan, M.D.     Lyanne Co, MD 07/03/12 [phone]

## 2012-07-04 LAB — ANA: Anti Nuclear Antibody(ANA): NEGATIVE

## 2012-07-20 ENCOUNTER — Encounter: Payer: Self-pay | Admitting: Vascular Surgery

## 2012-07-21 ENCOUNTER — Encounter: Payer: Medicare Other | Admitting: Vascular Surgery

## 2012-08-04 ENCOUNTER — Encounter: Payer: Medicare Other | Admitting: Vascular Surgery

## 2012-08-11 ENCOUNTER — Encounter: Payer: Medicare Other | Admitting: Vascular Surgery

## 2013-04-30 ENCOUNTER — Emergency Department (HOSPITAL_BASED_OUTPATIENT_CLINIC_OR_DEPARTMENT_OTHER)
Admission: EM | Admit: 2013-04-30 | Discharge: 2013-04-30 | Disposition: A | Discharge status: Home / Self Care | Payer: Medicare Other | Attending: Emergency Medicine | Admitting: Emergency Medicine

## 2013-04-30 ENCOUNTER — Emergency Department (HOSPITAL_BASED_OUTPATIENT_CLINIC_OR_DEPARTMENT_OTHER): Payer: Medicare Other

## 2013-04-30 ENCOUNTER — Encounter (HOSPITAL_BASED_OUTPATIENT_CLINIC_OR_DEPARTMENT_OTHER): Payer: Self-pay | Admitting: Emergency Medicine

## 2013-04-30 DIAGNOSIS — IMO0002 Reserved for concepts with insufficient information to code with codable children: Secondary | ICD-10-CM | POA: Insufficient documentation

## 2013-04-30 DIAGNOSIS — Y92009 Unspecified place in unspecified non-institutional (private) residence as the place of occurrence of the external cause: Secondary | ICD-10-CM | POA: Insufficient documentation

## 2013-04-30 DIAGNOSIS — F17200 Nicotine dependence, unspecified, uncomplicated: Secondary | ICD-10-CM | POA: Insufficient documentation

## 2013-04-30 DIAGNOSIS — S4980XA Other specified injuries of shoulder and upper arm, unspecified arm, initial encounter: Secondary | ICD-10-CM | POA: Insufficient documentation

## 2013-04-30 DIAGNOSIS — S0993XA Unspecified injury of face, initial encounter: Secondary | ICD-10-CM | POA: Insufficient documentation

## 2013-04-30 DIAGNOSIS — Y939 Activity, unspecified: Secondary | ICD-10-CM | POA: Insufficient documentation

## 2013-04-30 DIAGNOSIS — S4991XA Unspecified injury of right shoulder and upper arm, initial encounter: Secondary | ICD-10-CM

## 2013-04-30 DIAGNOSIS — Z7982 Long term (current) use of aspirin: Secondary | ICD-10-CM | POA: Insufficient documentation

## 2013-04-30 DIAGNOSIS — Z8679 Personal history of other diseases of the circulatory system: Secondary | ICD-10-CM | POA: Insufficient documentation

## 2013-04-30 DIAGNOSIS — W1809XA Striking against other object with subsequent fall, initial encounter: Secondary | ICD-10-CM | POA: Insufficient documentation

## 2013-04-30 DIAGNOSIS — E7800 Pure hypercholesterolemia, unspecified: Secondary | ICD-10-CM | POA: Insufficient documentation

## 2013-04-30 DIAGNOSIS — S46909A Unspecified injury of unspecified muscle, fascia and tendon at shoulder and upper arm level, unspecified arm, initial encounter: Secondary | ICD-10-CM | POA: Insufficient documentation

## 2013-04-30 DIAGNOSIS — Z79899 Other long term (current) drug therapy: Secondary | ICD-10-CM | POA: Insufficient documentation

## 2013-04-30 MED ORDER — IBUPROFEN 400 MG PO TABS
400.0000 mg | ORAL_TABLET | Freq: Once | ORAL | Status: AC
  Administered 2013-04-30: 400 mg via ORAL
  Filled 2013-04-30: qty 1

## 2013-04-30 MED ORDER — OXYCODONE-ACETAMINOPHEN 5-325 MG PO TABS
1.0000 | ORAL_TABLET | Freq: Once | ORAL | Status: AC
  Administered 2013-04-30: 1 via ORAL
  Filled 2013-04-30: qty 1

## 2013-04-30 MED ORDER — HYDROCODONE-ACETAMINOPHEN 5-325 MG PO TABS: 1.0000 | ORAL_TABLET | Freq: Four times a day (QID) | ORAL | Status: DC | PRN

## 2013-04-30 NOTE — ED Notes (Signed)
Tripped/Fell in driveway approx 7pm -pain to right shoulder-denies head injury or neck pain

## 2013-04-30 NOTE — ED Notes (Signed)
Pt alert, NAD, calm, interactive, resps e/u, speaking in clear complete sentences, cap refill <3sec, radial ulnar and brachial pulses intact, xray results reviewed.

## 2013-04-30 NOTE — ED Provider Notes (Signed)
CSN: [id number]     Arrival date & time 04/30/13  2010 History  This chart was scribed for Justin Seamen, MD by Blanchard Kelch, ED Scribe. The patient was seen in room MH01/MH01. Patient's care was started at 11:33 PM.    Chief Complaint  Patient presents with  . Fall   (Consider location/radiation/quality/duration/timing/severity/associated sxs/prior Treatment) Patient is a 79 y.o. male presenting with fall. The history is provided by the patient. No language interpreter was used.  Fall    HPI Comments: [name] is a 79 y.o. male who presents to the Emergency Department due to a mechanical fall that occurred at 6:30 PM. He believes his toe caught something on the ground in his driveway causing him to fall and hit his right shoulder. The pain to his shoulder is constant and was severe enough to make him "feel like he was going to be sick" immediately after the fall occurred. The pain is worsened with extension forward and backward. He also reports mild right sided neck pain which has developed slowly. He believes his last tetanus booster was within the last five years.  His PCP is Dr. Merri Brunette  Past Medical History  Diagnosis Date  . Aortic aneurysm   . Hypercholesteremia    Past Surgical History  Procedure Laterality Date  . Orthopedic surgery Left     wrist  . Hernia repair     No family history on file. History  Substance Use Topics  . Smoking status: Current Every Day Smoker    Types: Cigarettes  . Smokeless tobacco: Not on file  . Alcohol Use: Yes    Review of Systems A complete 10 system review of systems was obtained and all systems are negative except as noted in the HPI and PMH.    Allergies  Codeine  Home Medications   Current Outpatient Rx  Name  Route  Sig  Dispense  Refill  . aspirin EC 81 MG tablet   Oral   Take 81 mg by mouth daily.         Marland Kitchen HYDROcodone-acetaminophen (NORCO/VICODIN) 5-325 MG per tablet   Oral   Take 1 tablet by mouth  every 4 (four) hours as needed for pain.   15 tablet   0   . HYDROcodone-acetaminophen (NORCO/VICODIN) 5-325 MG per tablet   Oral   Take 1-2 tablets by mouth every 6 (six) hours as needed for moderate pain.   20 tablet   0   . omeprazole (PRILOSEC) 20 MG capsule   Oral   Take 20 mg by mouth daily.         . ondansetron (ZOFRAN ODT) 8 MG disintegrating tablet   Oral   Take 1 tablet (8 mg total) by mouth every 8 (eight) hours as needed for nausea.   10 tablet   0   . predniSONE (DELTASONE) 10 MG tablet   Oral   Take 6 tablets (60 mg total) by mouth daily.   30 tablet   0   . simvastatin (ZOCOR) 20 MG tablet   Oral   Take 20 mg by mouth every evening.          Triage Vitals: BP 162/91  Pulse 78  Temp(Src) 98.4 F (36.9 C) (Oral)  Resp 18  Ht 5\' 9"  (1.753 m)  Wt 160 lb (72.576 kg)  BMI 23.62 kg/m2  SpO2 95%  Physical Exam  Nursing note and vitals reviewed. General: Well-developed, well-nourished male in no acute distress; appearance  consistent with age of record HENT: normocephalic; atraumatic Eyes: pupils equal, round and reactive to light; extraocular muscles intact; arcus senilis bilaterally Neck: supple; no cervical spine tenderness; right sided muscular tenderness Heart: regular rate and rhythm; no murmurs, rubs or gallops Lungs: clear to auscultation bilaterally Abdomen: soft; nondistended; nontender; no masses or hepatosplenomegaly; bowel sounds present Extremities: No deformity; full range of motion; pulses normal; abrasion over right deltoid area with tenderness to palpation; pain with extension forward and backward at right shoulder Neurologic: Awake, alert and oriented; motor function intact in all extremities and symmetric; no facial droop Chest mild pectus carinatum Skin: Warm and dry Psychiatric: Normal mood and affect   ED Course  Procedures (including critical care time)  DIAGNOSTIC STUDIES: Oxygen Saturation is 95% on room air, adequate by  my interpretation.    COORDINATION OF CARE: 11:43 PM - Patient verbalizes understanding and agrees with treatment plan.  Labs Review Labs Reviewed - No data to display Imaging Review Dg Shoulder Right  04/30/2013   CLINICAL DATA:  Fall with shoulder pain  EXAM: RIGHT SHOULDER - 2+ VIEW  COMPARISON:  None.  FINDINGS: Negative for fracture or glenohumeral dislocation. Acromioclavicular distance upper limits of normal, without offset. Sclerotic focus in the glenoid is stable over multiple years when correlated with previous chest radiography.  IMPRESSION: Negative for fracture or glenohumeral dislocation.   Electronically Signed   By: Tiburcio Pea M.D.   On: 04/30/2013 21:02      MDM   1. Right shoulder injury, initial encounter    I personally performed the services described in this documentation, which was scribed in my presence.  The recorded information has been reviewed and is accurate.   Justin Seamen, MD 04/30/13 [phone]

## 2013-06-13 ENCOUNTER — Other Ambulatory Visit: Payer: Self-pay | Admitting: Family Medicine

## 2013-06-13 DIAGNOSIS — I714 Abdominal aortic aneurysm, without rupture, unspecified: Secondary | ICD-10-CM

## 2013-06-22 ENCOUNTER — Ambulatory Visit
Admission: RE | Admit: 2013-06-22 | Discharge: 2013-06-22 | Disposition: A | Discharge status: Home / Self Care | Payer: Medicare Other | Source: Ambulatory Visit | Attending: Family Medicine | Admitting: Family Medicine

## 2013-06-22 DIAGNOSIS — I714 Abdominal aortic aneurysm, without rupture, unspecified: Secondary | ICD-10-CM

## 2014-01-15 ENCOUNTER — Encounter: Payer: Self-pay | Admitting: Cardiology

## 2014-01-15 ENCOUNTER — Ambulatory Visit (INDEPENDENT_AMBULATORY_CARE_PROVIDER_SITE_OTHER): Payer: Medicare Other | Admitting: Cardiology

## 2014-01-15 VITALS — BP 122/68 | HR 66 | Ht 69.0 in | Wt 150.0 lb

## 2014-01-15 DIAGNOSIS — K219 Gastro-esophageal reflux disease without esophagitis: Secondary | ICD-10-CM

## 2014-01-15 DIAGNOSIS — F17200 Nicotine dependence, unspecified, uncomplicated: Secondary | ICD-10-CM

## 2014-01-15 DIAGNOSIS — Z720 Tobacco use: Secondary | ICD-10-CM | POA: Insufficient documentation

## 2014-01-15 DIAGNOSIS — E7800 Pure hypercholesterolemia, unspecified: Secondary | ICD-10-CM

## 2014-01-15 DIAGNOSIS — R0789 Other chest pain: Secondary | ICD-10-CM

## 2014-01-15 NOTE — Patient Instructions (Signed)
We will schedule you for an exercise stress test   

## 2014-01-16 NOTE — Progress Notes (Signed)
[name] Date of Birth: 1933/12/29 Medical Record #[id number]  History of Present Illness: [name] is seen at the request of Dr. Dema Severin for evaluation of chest pain. He is a pleasant 80 yo WM with history of tobacco abuse and hypercholesterolemia. He has a AAA measuring 2.9 cm. This past Thursday he experienced pain in the center of his chest while at work. The waxed and waned throughout the day. It was associated with a lot of belching. He does have a history of GERD. Over the next 2 days he had similar discomfort in the am that resolved with getting up. No history of cardiac disease. He had a normal Nuclear stress test in 2005. He does stay active and still works part time.     Medication List       This list is accurate as of: 01/15/14 11:59 PM.  Always use your most recent med list.               aspirin EC 81 MG tablet  Take 81 mg by mouth daily.     omeprazole 20 MG capsule  Commonly known as:  PRILOSEC  Take 20 mg by mouth daily.     simvastatin 20 MG tablet  Commonly known as:  ZOCOR  Take 20 mg by mouth every evening.         Allergies  Allergen Reactions  . Codeine     Hallucinations    Past Medical History  Diagnosis Date  . Aortic aneurysm   . Hypercholesteremia   . GERD (gastroesophageal reflux disease)     Past Surgical History  Procedure Laterality Date  . Orthopedic surgery Left     wrist  . Hernia repair      History   Social History  . Marital Status: Married    Spouse Name: N/A    Number of Children: 2  . Years of Education: N/A   Social History Main Topics  . Smoking status: Current Every Day Smoker    Types: Cigarettes  . Smokeless tobacco: None  . Alcohol Use: Yes  . Drug Use: No  . Sexual Activity: None   Other Topics Concern  . None   Social History Narrative  . None    No family history on file.  Review of Systems: As noted in HPI.  All other systems were reviewed and are negative.  Physical Exam: BP 122/68   Pulse 66  Ht 5\' 9"  (1.753 m)  Wt 150 lb (68.04 kg)  BMI 22.14 kg/m2 Filed Weights   01/15/14 1000  Weight: 150 lb (68.04 kg)  GENERAL:  Well appearing WM in NAD HEENT:  PERRL, EOMI, sclera are clear. Oropharynx is clear. NECK:  No jugular venous distention, carotid upstroke brisk and symmetric, no bruits, no thyromegaly or adenopathy LUNGS:  Clear to auscultation bilaterally CHEST:  Unremarkable HEART:  RRR,  PMI not displaced or sustained,S1 and S2 within normal limits, no S3, no S4: no clicks, no rubs, no murmurs ABD:  Soft, nontender. BS +, no masses or bruits. No hepatomegaly, no splenomegaly EXT:  2 + pulses throughout, no edema, no cyanosis no clubbing SKIN:  Warm and dry.  No rashes NEURO:  Alert and oriented x 3. Cranial nerves II through XII intact. PSYCH:  Cognitively intact    LABORATORY DATA: ECG 01/10/14 NSR Normal Ecg ECG: 01/15/14: NSR, normal.  01/10/14: creatinine 1.21. Other chemistries normal. Chol- 157, trig-114, LDL 92, HDL 44.  Assessment / Plan: 1. Chest  pain with atypical features. I suspect symptoms more related to GERD. He has risk factors for CAD with hypercholesterolemia and tobacco abuse. I have recommended an ETT to evaluate cardiac risk.  2. Hypercholesterolemia.  3. GERD  4. Tobacco abuse. Recommend smoking cessation.

## 2014-01-23 ENCOUNTER — Telehealth (HOSPITAL_COMMUNITY): Payer: Self-pay

## 2014-01-23 NOTE — Telephone Encounter (Signed)
Encounter complete. 

## 2014-01-24 ENCOUNTER — Telehealth (HOSPITAL_COMMUNITY): Payer: Self-pay

## 2014-01-24 NOTE — Telephone Encounter (Signed)
Encounter complete. 

## 2014-01-25 ENCOUNTER — Ambulatory Visit (HOSPITAL_COMMUNITY)
Admission: RE | Admit: 2014-01-25 | Discharge: 2014-01-25 | Disposition: A | Discharge status: Home / Self Care | Payer: Medicare Other | Source: Ambulatory Visit | Attending: Cardiology | Admitting: Cardiology

## 2014-01-25 DIAGNOSIS — R0789 Other chest pain: Secondary | ICD-10-CM | POA: Diagnosis not present

## 2014-01-25 DIAGNOSIS — E7800 Pure hypercholesterolemia, unspecified: Secondary | ICD-10-CM

## 2014-01-25 DIAGNOSIS — K219 Gastro-esophageal reflux disease without esophagitis: Secondary | ICD-10-CM

## 2014-01-25 DIAGNOSIS — Z720 Tobacco use: Secondary | ICD-10-CM

## 2014-01-28 NOTE — Procedures (Signed)
Exercise Treadmill Test  Pre-Exercise Testing Evaluation Rhythm: normal sinus  Rate: 70   ST Segments:  no significant ST changes at rest     Test  Exercise Tolerance Test Ordering MD: Peter Martinique, MD  Interpreting MD:  Unique Test No:1   Treadmill:  1  Indication for ETT: chest pain - rule out ischemia  Contraindication to ETT: No   Stress Modality: exercise - treadmill  Cardiac Imaging Performed: non   Protocol: standard Bruce - maximal  Max BP:  175/79  Max MPHR (bpm):  142 85% MPR (bpm): 120  MPHR obtained (bpm):  114 % MPHR obtained:  80  Reached 85% MPHR (min:sec):  N/A Total Exercise Time (min-sec): 4:11  Workload in METS:  6.00 Borg Scale: n/a  Reason ETT Terminated: leg pain    ST Segment Analysis At Rest: normal ST segments - no evidence of significant ST depression With Exercise: no evidence of significant ST depression  Other Information Arrhythmia:  No Angina during ETT:  absent (0); + Bilateral Leg Pain - ? claudication Quality of ETT:  non-diagnostic  ETT Interpretation:  borderline (indeterminate) - no Angina with symptom limiting leg pain and no Ischemic EKG changes.    Comments: Decreased Exercise Tolerance, unable to achieve target heart rate Although non-diagnostic, "atypical" chest pain not occuring at the patient's exercise limit would suggest that the pain is not anginal.   Recommendations: Clinical correlation - if the indicated, consider additional testing with imaging modality (Chemical Nuclear Myocardial Perfusion Testing)   Leonie Man, M.D., M.S. Interventional Cardiologist   Pager # [phone]

## 2014-01-30 ENCOUNTER — Encounter (HOSPITAL_COMMUNITY): Payer: Medicare Other

## 2014-02-10 ENCOUNTER — Encounter: Payer: Self-pay | Admitting: *Deleted

## 2014-08-21 ENCOUNTER — Ambulatory Visit: Payer: Self-pay

## 2014-08-21 ENCOUNTER — Ambulatory Visit (INDEPENDENT_AMBULATORY_CARE_PROVIDER_SITE_OTHER): Payer: Worker's Compensation | Admitting: Family Medicine

## 2014-08-21 VITALS — BP 134/78 | HR 72 | Temp 97.9°F | Resp 18 | Ht 70.5 in | Wt 150.2 lb

## 2014-08-21 DIAGNOSIS — S0033XA Contusion of nose, initial encounter: Secondary | ICD-10-CM | POA: Diagnosis not present

## 2014-08-21 DIAGNOSIS — S62604A Fracture of unspecified phalanx of right ring finger, initial encounter for closed fracture: Secondary | ICD-10-CM

## 2014-08-21 DIAGNOSIS — S60041A Contusion of right ring finger without damage to nail, initial encounter: Secondary | ICD-10-CM | POA: Diagnosis not present

## 2014-08-21 DIAGNOSIS — M79641 Pain in right hand: Secondary | ICD-10-CM

## 2014-08-21 NOTE — Patient Instructions (Signed)
You have a broken right ring finger- keep the splint on until you see the orthopedist.  Do not bend the end of your finger! We will get you in to see an orthopedist about your hand to make sure it heals properly At this point you can leave your nose uncovered but let us know if any sign of infection such as redness or swelling We will take you out of work until you are released by orthopedics

## 2014-08-21 NOTE — Progress Notes (Signed)
[facility] [address], [place] [address] [phone]  Date:  08/21/2014   Name:  [name]   DOB:  [birth year]   MRN:  [id number]  PCP:  Reginia Naas, MD    Chief Complaint: Hand Pain and Facial Injury   History of Present Illness:  [name] is a 81 y.o. very pleasant male patient who presents with the following:  He is here today with a WC injury.  Yesterday he fell, scraped his nose on some concrete and hurt his right ring finger.   His right ring finger is swollen and painful- he removed the ring that he was wearing due to swelling.   There was no LOC The nurse at the [employer] provided first aid, cleaned and placed steri- strips on his nose He does not have a headache- he had a bit of a HA for about one hour yesterday only.  Now he feels ok No vomiting or diarrhea His last tetanus shot was given less than 5 years ago elsewgere.   Patient Active Problem List   Diagnosis Date Noted  . Atypical chest pain 01/15/2014  . Tobacco abuse 01/15/2014  . GERD (gastroesophageal reflux disease) 01/15/2014  . Hypercholesterolemia 01/15/2014    Past Medical History  Diagnosis Date  . Aortic aneurysm   . Hypercholesteremia   . GERD (gastroesophageal reflux disease)     Past Surgical History  Procedure Laterality Date  . Orthopedic surgery Left     wrist  . Hernia repair      History  Substance Use Topics  . Smoking status: Current Every Day Smoker    Types: Cigarettes  . Smokeless tobacco: Not on file  . Alcohol Use: Yes    History reviewed. No pertinent family history.  Allergies  Allergen Reactions  . Codeine     Hallucinations    Medication list has been reviewed and updated.  Current Outpatient Prescriptions on File Prior to Visit  Medication Sig Dispense Refill  . aspirin EC 81 MG tablet Take 81 mg by mouth daily.    Marland Kitchen omeprazole (PRILOSEC) 20 MG capsule Take 20 mg by mouth daily.    . simvastatin (ZOCOR) 20 MG  tablet Take 20 mg by mouth every evening.     No current facility-administered medications on file prior to visit.    Review of Systems:  As per HPI- otherwise negative.   Physical Examination: Filed Vitals:   08/21/14 1506  BP: 134/78  Pulse: 72  Temp: 97.9 F (36.6 C)  Resp: 18   Filed Vitals:   08/21/14 1506  Height: 5' 10.5" (1.791 m)  Weight: 150 lb 3.2 oz (68.13 kg)   Body mass index is 21.24 kg/(m^2). Ideal Body Weight: Weight in (lb) to have BMI = 25: 176.4  GEN: WDWN, NAD, Non-toxic, A & O x 3, looks well, bandage on nose HEENT: Atraumatic, Normocephalic. Neck supple. No masses, No LAD.  Bilateral TM wnl, oropharynx normal.  PEERL,EOMI.   Nasal cavity is normal, no septal hematoma.  He has scraped the skin off the distal part of the nose and has a couple of abrasions on the lower part of his face but nothing that needs sutures.  Cleaned these with water and peroxide Ears and Nose: No external deformity. CV: RRR, No M/G/R. No JVD. No thrill. No extra heart sounds. PULM: CTA B, no wheezes, crackles, rhonchi. No retractions. No resp. distress. No accessory muscle use. EXTR: No c/c/e NEURO  Normal gait.  PSYCH: Normally interactive. Conversant. Not depressed or anxious appearing.  Calm demeanor.  Right ring finger is tender and mildly swollen distally.  The DIP is in very slight flexion- he is not sure if this is baseline due to OA.  Normal sensation and cap refill of finger.  He has pain with resisted extension of the finger.  Did not flex finger Hand is OW non- teder  [facility] reading (PRIMARY) by  Dr. Lorelei Pont. Nasal bones: negative Right hand: old avulsion fracture at DIP, long finger.  New avulsion fracture at dorsal DIP, ring finger causing a mallet finger  RIGHT HAND - COMPLETE 3+ VIEW  COMPARISON: None.  FINDINGS: Acute moderately displaced avulsion fracture is seen involving the posterior portion of the proximal base of the fourth distal phalanx. This  appears closed and posttraumatic. Probable old fracture is seen involving the distal portion of the third middle phalanx. Joint spaces are intact. No soft tissue abnormality is noted.  IMPRESSION: Acute an moderately displaced avulsion fracture seen involving the proximal base of the fourth distal phalanx.  CLINICAL DATA: Fall with trauma to the nose on the concrete.  EXAM: NASAL BONES - 3+ VIEW  COMPARISON: None.  FINDINGS: No nasal fracture. No fluid in the sinuses.  IMPRESSION: Negative  Assessment and Plan: Pain of right hand - Plan: DG Hand Complete Right  Contusion of right ring finger, initial encounter - Plan: DG Hand Complete Right  Nasal contusion, initial encounter - Plan: DG Nasal Bones  Fracture of phalanx of right ring finger, closed, initial encounter - Plan: Ambulatory referral to Hand Surgery  Placed right ring finger in a dorsal alluminum splint in full extension.  Not able to hyperextend due to limitations of this joint (I suspect he has some basal flexion due to OA).  Instructed him as to the importance of wearing this splint all the time to help this injury heal properly.  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   reassured that his nasal abrasion should do fine but he will seek care if any concern about infection, etc  Signed Lamar Blinks, MD

## 2014-11-06 ENCOUNTER — Emergency Department (HOSPITAL_BASED_OUTPATIENT_CLINIC_OR_DEPARTMENT_OTHER): Payer: Medicare Other

## 2014-11-06 ENCOUNTER — Encounter (HOSPITAL_BASED_OUTPATIENT_CLINIC_OR_DEPARTMENT_OTHER): Payer: Self-pay

## 2014-11-06 ENCOUNTER — Other Ambulatory Visit: Payer: Self-pay | Admitting: Family Medicine

## 2014-11-06 ENCOUNTER — Emergency Department (HOSPITAL_BASED_OUTPATIENT_CLINIC_OR_DEPARTMENT_OTHER)
Admission: EM | Admit: 2014-11-06 | Discharge: 2014-11-07 | Disposition: A | Discharge status: Home / Self Care | Payer: Medicare Other | Attending: Emergency Medicine | Admitting: Emergency Medicine

## 2014-11-06 DIAGNOSIS — E780 Pure hypercholesterolemia: Secondary | ICD-10-CM | POA: Insufficient documentation

## 2014-11-06 DIAGNOSIS — Z7982 Long term (current) use of aspirin: Secondary | ICD-10-CM | POA: Diagnosis not present

## 2014-11-06 DIAGNOSIS — Z720 Tobacco use: Secondary | ICD-10-CM | POA: Insufficient documentation

## 2014-11-06 DIAGNOSIS — R079 Chest pain, unspecified: Secondary | ICD-10-CM | POA: Insufficient documentation

## 2014-11-06 DIAGNOSIS — K219 Gastro-esophageal reflux disease without esophagitis: Secondary | ICD-10-CM | POA: Insufficient documentation

## 2014-11-06 DIAGNOSIS — Z8679 Personal history of other diseases of the circulatory system: Secondary | ICD-10-CM | POA: Insufficient documentation

## 2014-11-06 DIAGNOSIS — R519 Headache, unspecified: Secondary | ICD-10-CM

## 2014-11-06 DIAGNOSIS — R51 Headache: Principal | ICD-10-CM

## 2014-11-06 LAB — BASIC METABOLIC PANEL
Anion gap: 9 (ref 5–15)
BUN: 24 mg/dL — AB (ref 6–20)
CHLORIDE: 102 mmol/L (ref 101–111)
CO2: 28 mmol/L (ref 22–32)
Calcium: 8.8 mg/dL — ABNORMAL LOW (ref 8.9–10.3)
Creatinine, Ser: 1.38 mg/dL — ABNORMAL HIGH (ref 0.61–1.24)
GFR, EST AFRICAN AMERICAN: 55 mL/min — AB (ref >60)
GFR, EST NON AFRICAN AMERICAN: 47 mL/min — AB (ref >60)
Glucose, Bld: 98 mg/dL (ref 65–99)
Potassium: 3.5 mmol/L (ref 3.5–5.1)
Sodium: 139 mmol/L (ref 135–145)

## 2014-11-06 LAB — CBC WITH DIFFERENTIAL/PLATELET
Basophils Absolute: 0 10*3/uL (ref 0.0–0.1)
Basophils Relative: 1 % (ref 0–1)
Eosinophils Absolute: 0.2 10*3/uL (ref 0.0–0.7)
Eosinophils Relative: 3 % (ref 0–5)
HCT: 41.5 % (ref 39.0–52.0)
HEMOGLOBIN: 13.9 g/dL (ref 13.0–17.0)
LYMPHS ABS: 2.1 10*3/uL (ref 0.7–4.0)
Lymphocytes Relative: 29 % (ref 12–46)
MCH: 28.9 pg (ref 26.0–34.0)
MCHC: 33.5 g/dL (ref 30.0–36.0)
MCV: 86.3 fL (ref 78.0–100.0)
Monocytes Absolute: 0.6 10*3/uL (ref 0.1–1.0)
Monocytes Relative: 9 % (ref 3–12)
Neutro Abs: 4.1 10*3/uL (ref 1.7–7.7)
Neutrophils Relative %: 58 % (ref 43–77)
PLATELETS: 160 10*3/uL (ref 150–400)
RBC: 4.81 MIL/uL (ref 4.22–5.81)
RDW: 13.7 % (ref 11.5–15.5)
WBC: 7.1 10*3/uL (ref 4.0–10.5)

## 2014-11-06 LAB — TROPONIN I: Troponin I: <0.03 ng/mL (ref <0.031)

## 2014-11-06 NOTE — ED Notes (Signed)
Patient transported to X-ray 

## 2014-11-06 NOTE — Discharge Instructions (Signed)

## 2014-11-06 NOTE — ED Notes (Signed)
C/o chest pain  Epigastric area onset this pm  Denies n/v, no sob

## 2014-11-06 NOTE — ED Notes (Signed)
CP since 6pm

## 2014-11-06 NOTE — ED Provider Notes (Signed)
CSN: [id number]     Arrival date & time 11/06/14  2026 History  This chart was scribed for Malvin Johns, MD by Irene Pap, ED Scribe. This patient was seen in room MH10/MH10 and patient care was started at 9:16 PM.   Chief Complaint  Patient presents with  . Chest Pain   The history is provided by the patient. No language interpreter was used.    HPI Comments: [name] is a 81 y.o. male with a history of GERD and hypercholesteremia who presents to the Emergency Department complaining of central, non-radiating, aching chest pain onset 3 hours ago. He states that the pain lasted a few hours, currently denies pain. He states that he was eating [place] food when the pain started; states that he eats there a few times a week. He denies any other instances of chest pain today. He denies taking anything for the pain. He denies that anything makes the pain better or worse. He denies nausea, vomiting, diaphoresis, cough, cold or SOB. He states that his PCP is at [facility]; states that he had a stress test done within the last year that he reports as neg. He reports that he takes his cholesterol medication and baby aspirin daily, but denies taking any other medications. He denies history of heart problems.   Past Medical History  Diagnosis Date  . Aortic aneurysm   . Hypercholesteremia   . GERD (gastroesophageal reflux disease)    Past Surgical History  Procedure Laterality Date  . Orthopedic surgery Left     wrist  . Hernia repair     No family history on file. History  Substance Use Topics  . Smoking status: Current Every Day Smoker    Types: Cigarettes  . Smokeless tobacco: Not on file  . Alcohol Use: Yes    Review of Systems  Constitutional: Negative for fever, chills, diaphoresis and fatigue.  HENT: Negative for congestion, rhinorrhea and sneezing.   Eyes: Negative.   Respiratory: Negative for cough, chest tightness and shortness of breath.   Cardiovascular: Positive for chest  pain. Negative for leg swelling.  Gastrointestinal: Negative for nausea, vomiting, abdominal pain, diarrhea and blood in stool.  Genitourinary: Negative for frequency, hematuria, flank pain and difficulty urinating.  Musculoskeletal: Negative for back pain and arthralgias.  Skin: Negative for rash.  Neurological: Negative for dizziness, speech difficulty, weakness, numbness and headaches.   Allergies  Codeine  Home Medications   Prior to Admission medications   Medication Sig Start Date End Date Taking? Authorizing Provider  Pantoprazole Sodium (PROTONIX PO) Take by mouth.   Yes Historical Provider, MD  aspirin EC 81 MG tablet Take 81 mg by mouth daily.    Historical Provider, MD  simvastatin (ZOCOR) 20 MG tablet Take 20 mg by mouth every evening.    Historical Provider, MD   BP 148/78 mmHg  Pulse 66  Temp(Src) 98.3 F (36.8 C) (Oral)  Resp 22  Ht 5\' 9"  (1.753 m)  Wt 157 lb (71.215 kg)  BMI 23.17 kg/m2  SpO2 100% Physical Exam  Constitutional: He is oriented to person, place, and time. He appears well-developed and well-nourished.  HENT:  Head: Normocephalic and atraumatic.  Eyes: Pupils are equal, round, and reactive to light.  Neck: Normal range of motion. Neck supple.  Cardiovascular: Normal rate, regular rhythm and normal heart sounds.   Pulmonary/Chest: Effort normal and breath sounds normal. No respiratory distress. He has no wheezes. He has no rales. He exhibits no tenderness.  Abdominal:  Soft. Bowel sounds are normal. There is no tenderness. There is no rebound and no guarding.  Musculoskeletal: Normal range of motion. He exhibits no edema.  No edema or calf tenderness  Lymphadenopathy:    He has no cervical adenopathy.  Neurological: He is alert and oriented to person, place, and time.  Skin: Skin is warm and dry. No rash noted.  Psychiatric: He has a normal mood and affect.    ED Course  Procedures (including critical care time) DIAGNOSTIC STUDIES: Oxygen  Saturation is 100% on RA, normal by my interpretation.    COORDINATION OF CARE: 9:21 PM-Discussed treatment plan which includes labs and x-ray with pt at bedside and pt agreed to plan.   Labs Review Results for orders placed or performed during the hospital encounter of 03/12/24  Basic metabolic panel  Result Value Ref Range   Sodium 139 135 - 145 mmol/L   Potassium 3.5 3.5 - 5.1 mmol/L   Chloride 102 101 - 111 mmol/L   CO2 28 22 - 32 mmol/L   Glucose, Bld 98 65 - 99 mg/dL   BUN 24 (H) 6 - 20 mg/dL   Creatinine, Ser 1.38 (H) 0.61 - 1.24 mg/dL   Calcium 8.8 (L) 8.9 - 10.3 mg/dL   GFR calc non Af Amer 47 (L) >60 mL/min   GFR calc Af Amer 55 (L) >60 mL/min   Anion gap 9 5 - 15  CBC with Differential  Result Value Ref Range   WBC 7.1 4.0 - 10.5 K/uL   RBC 4.81 4.22 - 5.81 MIL/uL   Hemoglobin 13.9 13.0 - 17.0 g/dL   HCT 41.5 39.0 - 52.0 %   MCV 86.3 78.0 - 100.0 fL   MCH 28.9 26.0 - 34.0 pg   MCHC 33.5 30.0 - 36.0 g/dL   RDW 13.7 11.5 - 15.5 %   Platelets 160 150 - 400 K/uL   Neutrophils Relative % 58 43 - 77 %   Neutro Abs 4.1 1.7 - 7.7 K/uL   Lymphocytes Relative 29 12 - 46 %   Lymphs Abs 2.1 0.7 - 4.0 K/uL   Monocytes Relative 9 3 - 12 %   Monocytes Absolute 0.6 0.1 - 1.0 K/uL   Eosinophils Relative 3 0 - 5 %   Eosinophils Absolute 0.2 0.0 - 0.7 K/uL   Basophils Relative 1 0 - 1 %   Basophils Absolute 0.0 0.0 - 0.1 K/uL  Troponin I  Result Value Ref Range   Troponin I <0.03 <0.031 ng/mL   Dg Chest 2 View  11/06/2014   CLINICAL DATA:  One day history of chest pain  EXAM: CHEST  2 VIEW  COMPARISON:  January 31, 2008 there is stable scarring in the apices. There is no edema or consolidation. The heart size and pulmonary vascularity are normal. No adenopathy. No bone lesions.  FINDINGS: Scarring in the apices.  No edema or consolidation.  IMPRESSION: No active cardiopulmonary disease.   Electronically Signed   By: Lowella Grip III M.D.   On: 11/06/2014 21:19      Imaging Review Dg Chest 2 View  11/06/2014   CLINICAL DATA:  One day history of chest pain  EXAM: CHEST  2 VIEW  COMPARISON:  January 31, 2008 there is stable scarring in the apices. There is no edema or consolidation. The heart size and pulmonary vascularity are normal. No adenopathy. No bone lesions.  FINDINGS: Scarring in the apices.  No edema or consolidation.  IMPRESSION: No active cardiopulmonary  disease.   Electronically Signed   By: Lowella Grip III M.D.   On: 11/06/2014 21:19     EKG Interpretation   Date/Time:  Wednesday November 06 2014 20:34:53 EDT Ventricular Rate:  69 PR Interval:  128 QRS Duration: 82 QT Interval:  376 QTC Calculation: 402 R Axis:   51 Text Interpretation:  Normal sinus rhythm Normal ECG since last tracing no  significant change Confirmed by Noha Milberger  MD, Janthony Holleman ([id number]) on 11/06/2014  9:30:29 PM      MDM   Final diagnoses:  Chest pain, unspecified chest pain type    Patient presents with a 2 hour history of pain to his epigastric area that occurred while eating. He had no other associated symptoms. He had no exertional symptoms.  His EKG is non-concerning for acute ischemia. His troponin is negative. He doesn't have symptoms that would be more suggestive of thoracic dissection or aneurysm. He has no suggestions of pulmonary embolus. He's been pain-free since arrival to the ED. He has a heart score of 3 which is in the low risk category. If feel that he can be discharged home, however given his age I feel that he does need close follow-up with his primary care physician. He has had a stress test within the last year which he reports normal. He was given strict return precautions. As long as his second troponin is negative he will be discharged home.  I personally performed the services described in this documentation, which was scribed in my presence.  The recorded information has been reviewed and considered.    Malvin Johns, MD 11/06/14  2329

## 2014-11-07 LAB — TROPONIN I: Troponin I: <0.03 ng/mL (ref <0.031)

## 2014-11-08 ENCOUNTER — Other Ambulatory Visit: Payer: Self-pay

## 2015-03-03 ENCOUNTER — Other Ambulatory Visit: Payer: Self-pay | Admitting: Physician Assistant

## 2015-06-23 ENCOUNTER — Other Ambulatory Visit: Payer: Self-pay | Admitting: Family Medicine

## 2015-06-23 DIAGNOSIS — F17200 Nicotine dependence, unspecified, uncomplicated: Secondary | ICD-10-CM

## 2015-06-23 DIAGNOSIS — Z136 Encounter for screening for cardiovascular disorders: Secondary | ICD-10-CM

## 2015-06-23 DIAGNOSIS — I714 Abdominal aortic aneurysm, without rupture, unspecified: Secondary | ICD-10-CM

## 2015-07-04 ENCOUNTER — Ambulatory Visit
Admission: RE | Admit: 2015-07-04 | Discharge: 2015-07-04 | Disposition: A | Discharge status: Home / Self Care | Payer: Medicare Other | Source: Ambulatory Visit | Attending: Family Medicine | Admitting: Family Medicine

## 2015-07-04 ENCOUNTER — Other Ambulatory Visit: Payer: Self-pay | Admitting: Family Medicine

## 2015-07-04 DIAGNOSIS — Z136 Encounter for screening for cardiovascular disorders: Secondary | ICD-10-CM

## 2015-07-04 DIAGNOSIS — F17200 Nicotine dependence, unspecified, uncomplicated: Secondary | ICD-10-CM

## 2015-07-04 DIAGNOSIS — I714 Abdominal aortic aneurysm, without rupture, unspecified: Secondary | ICD-10-CM

## 2015-09-03 ENCOUNTER — Other Ambulatory Visit: Payer: Self-pay | Admitting: General Surgery

## 2015-09-03 DIAGNOSIS — R1031 Right lower quadrant pain: Secondary | ICD-10-CM

## 2015-09-04 ENCOUNTER — Ambulatory Visit
Admission: RE | Admit: 2015-09-04 | Discharge: 2015-09-04 | Disposition: A | Discharge status: Home / Self Care | Payer: Medicare Other | Source: Ambulatory Visit | Attending: General Surgery | Admitting: General Surgery

## 2015-09-04 ENCOUNTER — Encounter: Payer: Self-pay | Admitting: General Surgery

## 2015-09-04 DIAGNOSIS — R1031 Right lower quadrant pain: Secondary | ICD-10-CM

## 2015-09-04 MED ORDER — IOPAMIDOL (ISOVUE-300) INJECTION 61%
100.0000 mL | Freq: Once | INTRAVENOUS | Status: AC | PRN
  Administered 2015-09-04: 100 mL via INTRAVENOUS

## 2015-09-04 NOTE — Progress Notes (Signed)
I spoke with him regarding his CT report:  IMPRESSION: No evidence of inguinal hernia, mass, or lymphadenopathy.  3.1 cm distal abdominal aortic aneurysm. Recommend followup by ultrasound in 3 years. This recommendation follows ACR consensus guidelines: White Paper of the ACR Incidental Findings Committee II on Vascular Findings. J Am Coll Radiol 2013; 10:789-794.  Sigmoid diverticulosis. No radiographic evidence of diverticulitis or other acute findings.   I recommended 2 weeks of moist heat to the area and scheduled Tylenol for a possible muscle strain.  If he is not better with this approach, I asked him to call me.

## 2016-08-03 ENCOUNTER — Other Ambulatory Visit: Payer: Self-pay | Admitting: Family Medicine

## 2016-08-03 DIAGNOSIS — I714 Abdominal aortic aneurysm, without rupture, unspecified: Secondary | ICD-10-CM

## 2016-08-04 ENCOUNTER — Other Ambulatory Visit: Payer: Self-pay | Admitting: Family Medicine

## 2016-08-04 DIAGNOSIS — I714 Abdominal aortic aneurysm, without rupture, unspecified: Secondary | ICD-10-CM

## 2016-08-11 ENCOUNTER — Ambulatory Visit
Admission: RE | Admit: 2016-08-11 | Discharge: 2016-08-11 | Disposition: A | Discharge status: Home / Self Care | Payer: Medicare Other | Source: Ambulatory Visit | Attending: Family Medicine | Admitting: Family Medicine

## 2016-08-11 DIAGNOSIS — I714 Abdominal aortic aneurysm, without rupture, unspecified: Secondary | ICD-10-CM

## 2016-09-16 ENCOUNTER — Encounter: Payer: Self-pay | Admitting: Vascular Surgery

## 2016-09-24 ENCOUNTER — Ambulatory Visit (INDEPENDENT_AMBULATORY_CARE_PROVIDER_SITE_OTHER): Payer: Medicare Other | Admitting: Vascular Surgery

## 2016-09-24 ENCOUNTER — Encounter: Payer: Self-pay | Admitting: Vascular Surgery

## 2016-09-24 VITALS — BP 135/76 | HR 86 | Temp 97.6°F | Ht 69.0 in | Wt 146.5 lb

## 2016-09-24 DIAGNOSIS — I714 Abdominal aortic aneurysm, without rupture, unspecified: Secondary | ICD-10-CM

## 2016-09-24 NOTE — Progress Notes (Signed)
Patient ID: [name], male   DOB: 03-Jun-1933, 83 y.o.   MRN: [id number]  Reason for Consult: Establish Care and AAA (abdominal)   Referred by Carol Ada, MD  Subjective:     HPI:  [name] is a 83 y.o. male with known history of abdominal aortic aneurysm. He does not have other vascular surgical history is able to walk without issue has never had stroke or cardiac issues. He does not know about family history of aneurysms. He does not have new abdominal or back pain. He continues to smoke daily.  Past Medical History:  Diagnosis Date  . AAA (abdominal aortic aneurysm) ([place])   . Aortic aneurysm ([place])   . GERD (gastroesophageal reflux disease)   . Hypercholesteremia    Family History  Problem Relation Age of Onset  . Heart disease Mother   . Heart attack Sister    Past Surgical History:  Procedure Laterality Date  . HERNIA REPAIR    . ORTHOPEDIC SURGERY Left    wrist    Short Social History:  Social History  Substance Use Topics  . Smoking status: Current Every Day Smoker    Types: Cigarettes  . Smokeless tobacco: Current User  . Alcohol use Yes    Allergies  Allergen Reactions  . Codeine     Hallucinations    Current Outpatient Prescriptions  Medication Sig Dispense Refill  . aspirin EC 81 MG tablet Take 81 mg by mouth daily.    . Pantoprazole Sodium (PROTONIX PO) Take by mouth.    . simvastatin (ZOCOR) 20 MG tablet Take 20 mg by mouth every evening.     No current facility-administered medications for this visit.     Review of Systems  Constitutional:  Constitutional negative. HENT: HENT negative.  Eyes: Eyes negative.  Respiratory: Respiratory negative.  Cardiovascular: Cardiovascular negative.  GI: Gastrointestinal negative.  Musculoskeletal: Musculoskeletal negative.  Skin: Skin negative.  Neurological: Neurological negative. Hematologic: Hematologic/lymphatic negative.  Psychiatric: Psychiatric negative.        Objective:    Objective   Vitals:   09/24/16 1033  BP: 135/76  Pulse: 86  Temp: 97.6 F (36.4 C)  SpO2: 96%  Weight: 146 lb 8 oz (66.5 kg)  Height: 5\' 9"  (1.753 m)   Body mass index is 21.63 kg/m.  Physical Exam  Constitutional: He is oriented to person, place, and time. He appears well-developed.  HENT:  Head: Normocephalic.  Eyes: Pupils are equal, round, and reactive to light.  Neck: Normal range of motion.  Cardiovascular: Normal rate.   Pulses:      Carotid pulses are 2+ on the right side, and 2+ on the left side.      Radial pulses are 2+ on the right side, and 2+ on the left side.       Femoral pulses are 2+ on the right side, and 2+ on the left side.      Popliteal pulses are 2+ on the right side, and 2+ on the left side.  Pulmonary/Chest: Effort normal.  Abdominal: Soft. He exhibits no mass.  Musculoskeletal: He exhibits no edema.  Neurological: He is alert and oriented to person, place, and time.  Skin: Skin is warm and dry.  Psychiatric: He has a normal mood and affect. His behavior is normal. Judgment and thought content normal.    Data: Study Result   CLINICAL DATA:  Abdominal aortic aneurysm without rupture.  EXAM: ULTRASOUND OF ABDOMINAL AORTA  TECHNIQUE: Ultrasound examination of  the abdominal aorta was performed to evaluate for abdominal aortic aneurysm.  COMPARISON:  Ultrasound of June 22, 2013.  FINDINGS: Abdominal Aorta  Atherosclerosis is noted. Abdominal aorta measures 1.9 cm in maximum diameter proximally, 2.3 cm in its midportion, and 3.6 cm in AP orientation distally.  Maximum Diameter: 3.6 cm distally.  IMPRESSION: 3.6 cm infrarenal abdominal aortic aneurysm. Recommend followup by ultrasound in 2 years. This recommendation follows ACR consensus guidelines: White Paper of the ACR Incidental Findings Committee II on Vascular Findings. J Am Coll Radiol 2013; 10:789-794.        Assessment/Plan:     83 year old male with history  of abdominal aortic aneurysm that was 2.9 cm in 2014 by CT scan and was clearly infrarenal. He now has 3.6 cm by recent ultrasound and has been referred for evaluation. I discussed with him the low likelihood of rupture but also the signs and symptoms of abdominal or back pain and the risk should he have rupture. He demonstrates very good understanding of this. He also has easily palpable popliteal pulses without any lower extremity symptoms. We will have him follow up in 2 years with aortic aneurysm duplex as well as bilateral lower extremity duplexes to rule out popliteal aneurysms. We will see him sooner if he has issues.     Waynetta Sandy MD [facility]

## 2017-02-22 ENCOUNTER — Emergency Department (HOSPITAL_COMMUNITY): Payer: Medicare Other

## 2017-02-22 ENCOUNTER — Other Ambulatory Visit: Payer: Self-pay

## 2017-02-22 DIAGNOSIS — F17210 Nicotine dependence, cigarettes, uncomplicated: Secondary | ICD-10-CM | POA: Diagnosis not present

## 2017-02-22 DIAGNOSIS — Z7982 Long term (current) use of aspirin: Secondary | ICD-10-CM | POA: Diagnosis not present

## 2017-02-22 DIAGNOSIS — Z79899 Other long term (current) drug therapy: Secondary | ICD-10-CM | POA: Diagnosis not present

## 2017-02-22 DIAGNOSIS — R1030 Lower abdominal pain, unspecified: Secondary | ICD-10-CM | POA: Insufficient documentation

## 2017-02-22 NOTE — ED Triage Notes (Signed)
Patient has a hx of aaa without rupture. Patient is complaining of pain waking him up. Patient states it is excruciating pain.

## 2017-02-23 ENCOUNTER — Emergency Department (HOSPITAL_COMMUNITY): Payer: Medicare Other

## 2017-02-23 ENCOUNTER — Emergency Department (HOSPITAL_COMMUNITY)
Admission: EM | Admit: 2017-02-23 | Discharge: 2017-02-23 | Disposition: A | Discharge status: Home / Self Care | Payer: Medicare Other | Attending: Emergency Medicine | Admitting: Emergency Medicine

## 2017-02-23 ENCOUNTER — Encounter (HOSPITAL_COMMUNITY): Payer: Self-pay

## 2017-02-23 DIAGNOSIS — R1030 Lower abdominal pain, unspecified: Secondary | ICD-10-CM

## 2017-02-23 LAB — URINALYSIS, ROUTINE W REFLEX MICROSCOPIC
Bilirubin Urine: NEGATIVE
GLUCOSE, UA: NEGATIVE mg/dL
Hgb urine dipstick: NEGATIVE
KETONES UR: NEGATIVE mg/dL
LEUKOCYTES UA: NEGATIVE
Nitrite: NEGATIVE
PROTEIN: NEGATIVE mg/dL
Specific Gravity, Urine: 1.025 (ref 1.005–1.030)
pH: 6 (ref 5.0–8.0)

## 2017-02-23 LAB — CBC
HEMATOCRIT: 42.8 % (ref 39.0–52.0)
HEMOGLOBIN: 14 g/dL (ref 13.0–17.0)
MCH: 28.4 pg (ref 26.0–34.0)
MCHC: 32.7 g/dL (ref 30.0–36.0)
MCV: 86.8 fL (ref 78.0–100.0)
Platelets: 144 10*3/uL — ABNORMAL LOW (ref 150–400)
RBC: 4.93 MIL/uL (ref 4.22–5.81)
RDW: 13.6 % (ref 11.5–15.5)
WBC: 7.5 10*3/uL (ref 4.0–10.5)

## 2017-02-23 LAB — BASIC METABOLIC PANEL
Anion gap: 8 (ref 5–15)
BUN: 16 mg/dL (ref 6–20)
CALCIUM: 8.8 mg/dL — AB (ref 8.9–10.3)
CO2: 28 mmol/L (ref 22–32)
Chloride: 104 mmol/L (ref 101–111)
Creatinine, Ser: 1.41 mg/dL — ABNORMAL HIGH (ref 0.61–1.24)
GFR calc Af Amer: 52 mL/min — ABNORMAL LOW (ref >60)
GFR calc non Af Amer: 45 mL/min — ABNORMAL LOW (ref >60)
GLUCOSE: 83 mg/dL (ref 65–99)
POTASSIUM: 3.6 mmol/L (ref 3.5–5.1)
Sodium: 140 mmol/L (ref 135–145)

## 2017-02-23 LAB — POCT I-STAT TROPONIN I: TROPONIN I, POC: 0 ng/mL (ref 0.00–0.08)

## 2017-02-23 MED ORDER — ACETAMINOPHEN 500 MG PO TABS: 500.0000 mg | ORAL_TABLET | Freq: Four times a day (QID) | ORAL | 0 refills | Status: DC | PRN

## 2017-02-23 MED ORDER — IOPAMIDOL (ISOVUE-370) INJECTION 76%
100.0000 mL | Freq: Once | INTRAVENOUS | Status: AC | PRN
  Administered 2017-02-23: 80 mL via INTRAVENOUS

## 2017-02-23 MED ORDER — ACETAMINOPHEN 500 MG PO TABS
1000.0000 mg | ORAL_TABLET | Freq: Once | ORAL | Status: AC
  Administered 2017-02-23: 1000 mg via ORAL
  Filled 2017-02-23: qty 2

## 2017-02-23 MED ORDER — IOPAMIDOL (ISOVUE-370) INJECTION 76%
INTRAVENOUS | Status: AC
  Administered 2017-02-23: 80 mL via INTRAVENOUS
  Filled 2017-02-23: qty 100

## 2017-02-23 MED ORDER — ONDANSETRON HCL 4 MG/2ML IJ SOLN
4.0000 mg | Freq: Once | INTRAMUSCULAR | Status: AC
  Administered 2017-02-23: 4 mg via INTRAVENOUS
  Filled 2017-02-23: qty 2

## 2017-02-23 MED ORDER — MORPHINE SULFATE (PF) 4 MG/ML IV SOLN
4.0000 mg | Freq: Once | INTRAVENOUS | Status: AC
  Administered 2017-02-23: 4 mg via INTRAVENOUS
  Filled 2017-02-23: qty 1

## 2017-02-23 NOTE — Discharge Instructions (Signed)
You were seen today for abdominal pain.  Your CT scan is reassuring. Take Tylenol as needed for pain. Follow-up with her primary physician if symptoms worsen or recur.

## 2017-02-23 NOTE — ED Notes (Signed)
Pt in Ct  

## 2017-02-23 NOTE — ED Provider Notes (Signed)
[facility] DEPT Provider Note   CSN: [id number] Arrival date & time: 02/22/17  2335     History   Chief Complaint Chief Complaint  Patient presents with  . Abdominal Pain    HPI [name] is a 83 y.o. male.  HPI  This is an 83 year old male with a history of AAA who presents with abdominal pain. Patient reports acute onset of lower abdominal pain that woke him from sleep prior to arrival. Denies any nausea, vomiting, diarrhea. Currently he rates his pain at 6 out of 10. He has not taken anything for the pain. He denies back pain. He denies urinary symptoms or fevers. He has never had pain like this before. He states that it is crampy and nonradiating.  Past Medical History:  Diagnosis Date  . AAA (abdominal aortic aneurysm) ([place])   . Aortic aneurysm ([place])   . GERD (gastroesophageal reflux disease)   . Hypercholesteremia     Patient Active Problem List   Diagnosis Date Noted  . Atypical chest pain 01/15/2014  . Tobacco abuse 01/15/2014  . GERD (gastroesophageal reflux disease) 01/15/2014  . Hypercholesterolemia 01/15/2014    Past Surgical History:  Procedure Laterality Date  . HERNIA REPAIR    . ORTHOPEDIC SURGERY Left    wrist       Home Medications    Prior to Admission medications   Medication Sig Start Date End Date Taking? Authorizing Provider  aspirin EC 81 MG tablet Take 81 mg by mouth daily.   Yes [provider]  pantoprazole (PROTONIX) 40 MG tablet Take 40 mg by mouth daily.   Yes [provider]  simvastatin (ZOCOR) 20 MG tablet Take 20 mg by mouth every evening.   Yes [provider]  acetaminophen (TYLENOL) 500 MG tablet Take 1 tablet (500 mg total) by mouth every 6 (six) hours as needed. 02/23/17   Horton, Barbette Hair, MD    Family History Family History  Problem Relation Age of Onset  . Heart disease Mother   . Heart attack Sister     Social History Social History  Substance Use Topics  . Smoking  status: Current Every Day Smoker    Types: Cigarettes  . Smokeless tobacco: Current User  . Alcohol use Yes     Allergies   Codeine   Review of Systems Review of Systems  Constitutional: Negative for fever.  Respiratory: Negative for shortness of breath.   Cardiovascular: Negative for chest pain.  Gastrointestinal: Positive for abdominal pain. Negative for diarrhea, nausea and vomiting.  Genitourinary: Negative for dysuria.  Musculoskeletal: Negative for back pain.  All other systems reviewed and are negative.    Physical Exam Updated Vital Signs BP (!) 142/65 (BP Location: Right Arm)   Pulse (!) 55   Temp 97.8 F (36.6 C) (Oral)   Resp 16   Ht 5\' 9"  (1.753 m)   Wt 65.8 kg (145 lb)   SpO2 100%   BMI 21.41 kg/m   Physical Exam  Constitutional: He is oriented to person, place, and time. He appears well-developed and well-nourished.  Elderly, no acute distress  HENT:  Head: Normocephalic and atraumatic.  Cardiovascular: Normal rate, regular rhythm and normal heart sounds.   No murmur heard. Pulmonary/Chest: Effort normal. No respiratory distress.  Abdominal: Soft. Bowel sounds are normal. There is tenderness. There is no rebound and no guarding.  Mild suprapubic tenderness to palpation without rebound or guarding  Musculoskeletal: He exhibits no edema.  Neurological: He is alert  and oriented to person, place, and time.  Skin: Skin is warm and dry.  Psychiatric: He has a normal mood and affect.  Nursing note and vitals reviewed.    ED Treatments / Results  Labs (all labs ordered are listed, but only abnormal results are displayed) Labs Reviewed  BASIC METABOLIC PANEL - Abnormal; Notable for the following:       Result Value   Creatinine, Ser 1.41 (*)    Calcium 8.8 (*)    GFR calc non Af Amer 45 (*)    GFR calc Af Amer 52 (*)    All other components within normal limits  CBC - Abnormal; Notable for the following:    Platelets 144 (*)    All other  components within normal limits  URINALYSIS, ROUTINE W REFLEX MICROSCOPIC  I-STAT TROPONIN, ED  POCT I-STAT TROPONIN I    EKG  EKG Interpretation None       Radiology Dg Chest 2 View  Result Date: 02/23/2017 CLINICAL DATA:  Periumbilical abdominal pain, onset at sleep tonight EXAM: CHEST  2 VIEW COMPARISON:  11/06/2014 FINDINGS: Stable apical scarring bilaterally. The lungs are otherwise clear. There is no pleural effusion. The pulmonary vasculature is normal. Heart size is normal. Hilar and mediastinal contours are unremarkable and unchanged. IMPRESSION: Stable apical scarring bilaterally. No acute cardiopulmonary findings. Electronically Signed   By: Andreas Newport M.D.   On: 02/23/2017 00:17   Ct Angio Abd/pel W And/or Wo Contrast  Result Date: 02/23/2017 CLINICAL DATA:  Acute onset of severe generalized abdominal pain. Initial encounter. EXAM: CTA ABDOMEN AND PELVIS wITHOUT AND WITH CONTRAST TECHNIQUE: Multidetector CT imaging of the abdomen and pelvis was performed using the standard protocol during bolus administration of intravenous contrast. Multiplanar reconstructed images and MIPs were obtained and reviewed to evaluate the vascular anatomy. CONTRAST:  80 mL of Isovue 370 IV contrast COMPARISON:  CT of the abdomen and pelvis performed 07/03/2012 FINDINGS: VASCULAR Aorta: There is borderline aneurysmal dilatation of the infrarenal abdominal aorta to 3.1 cm in maximal AP dimension, with mild associated thrombus. Aneurysmal dilatation resolves just proximal to the aortic bifurcation. Scattered calcification is seen along the abdominal aorta and its branches. Celiac: The celiac trunk appears fully patent. SMA: The superior mesenteric artery remains patent, with perhaps minimal mural thrombus along the mid superior mesenteric artery. Renals: The renal arteries appear patent bilaterally, with 2 right-sided renal arteries seen. IMA: The inferior mesenteric artery remains patent. Inflow:  Scattered calcification is noted along the common and internal iliac arteries bilaterally, and also at the common femoral arteries bilaterally. Proximal Outflow: The proximal profunda femoris artery and superficial femoral arteries appear patent bilaterally. Veins: The visualized venous structures are grossly unremarkable. The inferior vena cava is grossly unremarkable in appearance. Review of the MIP images confirms the above findings. NON-VASCULAR Lower chest: The visualized portions of the mediastinum are unremarkable. The visualized lung bases are grossly clear. Hepatobiliary: Scattered calcified granulomata are noted within the liver. The liver is otherwise unremarkable. The gallbladder is unremarkable in appearance. Pancreas: The pancreas is within normal limits. Spleen: The spleen is unremarkable in appearance. Adrenals/Urinary Tract: The adrenal glands are unremarkable in appearance. A right renal cyst is noted. There is no evidence of hydronephrosis. No renal or ureteral stones are identified. No perinephric stranding is seen. Stomach/Bowel: The stomach is unremarkable in appearance. The small bowel is within normal limits. The appendix is normal in caliber, without evidence of appendicitis. Mild diverticulosis is noted at the mid sigmoid  colon, without evidence of diverticulitis. Lymphatic: No retroperitoneal or pelvic sidewall lymphadenopathy is seen. Reproductive: The bladder is moderately distended and grossly unremarkable. The prostate is enlarged, measuring 6.1 cm in transverse dimension. Other: No additional soft tissue abnormalities are seen. Musculoskeletal: No acute osseous abnormalities are identified. The visualized musculature is unremarkable in appearance. IMPRESSION: VASCULAR 1. **An incidental finding of potential clinical significance has been found. Borderline aneurysmal dilatation of the infrarenal abdominal aorta to 3.1 cm in AP dimension, with mild associated thrombus but no significant  luminal narrowing. This resolves just proximal to the aortic bifurcation. Recommend followup by ultrasound in 3 years. This recommendation follows ACR consensus guidelines: White Paper of the ACR Incidental Findings Committee II on Vascular Findings. J Am Coll Radiol 2013; 10:789-794** 2. Scattered aortic atherosclerosis. 3. Superior mesenteric artery remains patent. There is perhaps minimal mural thrombus along the midportion of the superior mesenteric artery, without significant luminal narrowing. NON-VASCULAR 1. Right renal cyst noted. 2. Mild diverticulosis at the mid sigmoid colon, without evidence of diverticulitis. 3. Enlarged prostate noted. Electronically Signed   By: Garald Balding M.D.   On: 02/23/2017 04:06    Procedures Procedures (including critical care time)  Medications Ordered in ED Medications  morphine 4 MG/ML injection 4 mg (4 mg Intravenous Given 02/23/17 0234)  ondansetron (ZOFRAN) injection 4 mg (4 mg Intravenous Given 02/23/17 0233)  iopamidol (ISOVUE-370) 76 % injection 100 mL (80 mLs Intravenous Contrast Given 02/23/17 0247)  acetaminophen (TYLENOL) tablet 1,000 mg (1,000 mg Oral Given 02/23/17 0420)     Initial Impression / Assessment and Plan / ED Course  I have reviewed the triage vital signs and the nursing notes.  Pertinent labs & imaging results that were available during my care of the patient were reviewed by me and considered in my medical decision making (see chart for details).     Patient presents with abdominal pain. No other associated symptoms. Acute onset. History of AAA. Exam is fairly benign. Patient was given pain and nausea medication. CT angio obtained to evaluate AAA. Appears stable from prior ultrasound. Lab work is otherwise unremarkable. Etiology of his abdominal pain is unknown at this time. However, workup is fairly reassuring. Patient improved on repeat examination. Recommend Tylenol at home and repeat evaluation of PCP.  After history,  exam, and medical workup I feel the patient has been appropriately medically screened and is safe for discharge home. Pertinent diagnoses were discussed with the patient. Patient was given return precautions.   Final Clinical Impressions(s) / ED Diagnoses   Final diagnoses:  Lower abdominal pain    New Prescriptions New Prescriptions   ACETAMINOPHEN (TYLENOL) 500 MG TABLET    Take 1 tablet (500 mg total) by mouth every 6 (six) hours as needed.     Merryl Hacker, MD 02/23/17 [phone]

## 2017-07-29 DIAGNOSIS — M25561 Pain in right knee: Secondary | ICD-10-CM | POA: Insufficient documentation

## 2017-08-22 ENCOUNTER — Other Ambulatory Visit: Payer: Self-pay | Admitting: Family Medicine

## 2017-08-22 DIAGNOSIS — I714 Abdominal aortic aneurysm, without rupture, unspecified: Secondary | ICD-10-CM

## 2017-08-30 ENCOUNTER — Ambulatory Visit
Admission: RE | Admit: 2017-08-30 | Discharge: 2017-08-30 | Disposition: A | Discharge status: Home / Self Care | Payer: Medicare Other | Source: Ambulatory Visit | Attending: Family Medicine | Admitting: Family Medicine

## 2017-08-30 DIAGNOSIS — I714 Abdominal aortic aneurysm, without rupture, unspecified: Secondary | ICD-10-CM

## 2017-10-18 ENCOUNTER — Other Ambulatory Visit: Payer: Self-pay | Admitting: Family Medicine

## 2017-10-18 ENCOUNTER — Ambulatory Visit
Admission: RE | Admit: 2017-10-18 | Discharge: 2017-10-18 | Disposition: A | Discharge status: Home / Self Care | Payer: Medicare Other | Source: Ambulatory Visit | Attending: Family Medicine | Admitting: Family Medicine

## 2017-10-18 DIAGNOSIS — R1030 Lower abdominal pain, unspecified: Secondary | ICD-10-CM

## 2017-10-18 MED ORDER — IOPAMIDOL (ISOVUE-300) INJECTION 61%
100.0000 mL | Freq: Once | INTRAVENOUS | Status: AC | PRN
  Administered 2017-10-18: 100 mL via INTRAVENOUS

## 2018-09-29 ENCOUNTER — Encounter (HOSPITAL_COMMUNITY): Payer: Medicare Other

## 2018-09-29 ENCOUNTER — Ambulatory Visit: Payer: Medicare Other | Admitting: Family

## 2018-09-29 ENCOUNTER — Other Ambulatory Visit (HOSPITAL_COMMUNITY): Payer: Medicare Other

## 2019-03-31 ENCOUNTER — Other Ambulatory Visit: Payer: Self-pay

## 2019-03-31 DIAGNOSIS — Z20828 Contact with and (suspected) exposure to other viral communicable diseases: Secondary | ICD-10-CM

## 2019-03-31 DIAGNOSIS — Z20822 Contact with and (suspected) exposure to covid-19: Secondary | ICD-10-CM

## 2019-04-03 LAB — NOVEL CORONAVIRUS, NAA: SARS-CoV-2, NAA: NOT DETECTED

## 2019-04-06 ENCOUNTER — Telehealth: Payer: Self-pay | Admitting: General Practice

## 2019-04-06 NOTE — Telephone Encounter (Signed)
Gave patient negative covid test results Patient understood 

## 2019-05-30 ENCOUNTER — Ambulatory Visit: Payer: Medicare Other | Attending: Internal Medicine

## 2019-05-30 DIAGNOSIS — Z20822 Contact with and (suspected) exposure to covid-19: Secondary | ICD-10-CM

## 2019-05-31 LAB — NOVEL CORONAVIRUS, NAA: SARS-CoV-2, NAA: DETECTED — AB

## 2019-08-21 ENCOUNTER — Other Ambulatory Visit: Payer: Self-pay | Admitting: Family Medicine

## 2019-08-21 DIAGNOSIS — I714 Abdominal aortic aneurysm, without rupture, unspecified: Secondary | ICD-10-CM

## 2019-08-31 ENCOUNTER — Ambulatory Visit
Admission: RE | Admit: 2019-08-31 | Discharge: 2019-08-31 | Disposition: A | Discharge status: Home / Self Care | Payer: Medicare Other | Source: Ambulatory Visit | Attending: Family Medicine | Admitting: Family Medicine

## 2019-08-31 DIAGNOSIS — I714 Abdominal aortic aneurysm, without rupture, unspecified: Secondary | ICD-10-CM

## 2020-06-15 ENCOUNTER — Emergency Department (HOSPITAL_COMMUNITY): Payer: Medicare Other

## 2020-06-15 ENCOUNTER — Emergency Department (HOSPITAL_COMMUNITY)
Admission: EM | Admit: 2020-06-15 | Discharge: 2020-06-15 | Disposition: A | Discharge status: Home / Self Care | Payer: Medicare Other | Attending: Emergency Medicine | Admitting: Emergency Medicine

## 2020-06-15 ENCOUNTER — Other Ambulatory Visit: Payer: Self-pay

## 2020-06-15 ENCOUNTER — Encounter (HOSPITAL_COMMUNITY): Payer: Self-pay

## 2020-06-15 DIAGNOSIS — S0993XA Unspecified injury of face, initial encounter: Secondary | ICD-10-CM | POA: Diagnosis present

## 2020-06-15 DIAGNOSIS — R519 Headache, unspecified: Secondary | ICD-10-CM | POA: Diagnosis not present

## 2020-06-15 DIAGNOSIS — S60819A Abrasion of unspecified wrist, initial encounter: Secondary | ICD-10-CM | POA: Diagnosis not present

## 2020-06-15 DIAGNOSIS — M79641 Pain in right hand: Secondary | ICD-10-CM | POA: Diagnosis not present

## 2020-06-15 DIAGNOSIS — Y9222 Religious institution as the place of occurrence of the external cause: Secondary | ICD-10-CM | POA: Diagnosis not present

## 2020-06-15 DIAGNOSIS — Z7982 Long term (current) use of aspirin: Secondary | ICD-10-CM | POA: Diagnosis not present

## 2020-06-15 DIAGNOSIS — R6889 Other general symptoms and signs: Secondary | ICD-10-CM | POA: Diagnosis not present

## 2020-06-15 DIAGNOSIS — F17210 Nicotine dependence, cigarettes, uncomplicated: Secondary | ICD-10-CM | POA: Insufficient documentation

## 2020-06-15 DIAGNOSIS — Z23 Encounter for immunization: Secondary | ICD-10-CM | POA: Diagnosis not present

## 2020-06-15 DIAGNOSIS — M19032 Primary osteoarthritis, left wrist: Secondary | ICD-10-CM | POA: Diagnosis not present

## 2020-06-15 DIAGNOSIS — S0990XA Unspecified injury of head, initial encounter: Secondary | ICD-10-CM | POA: Diagnosis not present

## 2020-06-15 DIAGNOSIS — S0081XA Abrasion of other part of head, initial encounter: Secondary | ICD-10-CM | POA: Insufficient documentation

## 2020-06-15 DIAGNOSIS — M19031 Primary osteoarthritis, right wrist: Secondary | ICD-10-CM | POA: Diagnosis not present

## 2020-06-15 DIAGNOSIS — Z043 Encounter for examination and observation following other accident: Secondary | ICD-10-CM | POA: Diagnosis not present

## 2020-06-15 DIAGNOSIS — W108XXA Fall (on) (from) other stairs and steps, initial encounter: Secondary | ICD-10-CM | POA: Diagnosis not present

## 2020-06-15 DIAGNOSIS — Z743 Need for continuous supervision: Secondary | ICD-10-CM | POA: Diagnosis not present

## 2020-06-15 DIAGNOSIS — R58 Hemorrhage, not elsewhere classified: Secondary | ICD-10-CM | POA: Diagnosis not present

## 2020-06-15 DIAGNOSIS — S0121XA Laceration without foreign body of nose, initial encounter: Secondary | ICD-10-CM | POA: Diagnosis not present

## 2020-06-15 DIAGNOSIS — W19XXXA Unspecified fall, initial encounter: Secondary | ICD-10-CM

## 2020-06-15 DIAGNOSIS — R52 Pain, unspecified: Secondary | ICD-10-CM | POA: Diagnosis not present

## 2020-06-15 MED ORDER — ACETAMINOPHEN 500 MG PO TABS
1000.0000 mg | ORAL_TABLET | Freq: Once | ORAL | Status: AC
  Administered 2020-06-15: 1000 mg via ORAL
  Filled 2020-06-15: qty 2

## 2020-06-15 MED ORDER — BACITRACIN ZINC 500 UNIT/GM EX OINT
TOPICAL_OINTMENT | Freq: Two times a day (BID) | CUTANEOUS | Status: DC
  Administered 2020-06-15: 1 via TOPICAL
  Filled 2020-06-15: qty 1.8

## 2020-06-15 MED ORDER — TETANUS-DIPHTH-ACELL PERTUSSIS 5-2.5-18.5 LF-MCG/0.5 IM SUSY
0.5000 mL | PREFILLED_SYRINGE | Freq: Once | INTRAMUSCULAR | Status: AC
  Administered 2020-06-15: 0.5 mL via INTRAMUSCULAR
  Filled 2020-06-15: qty 0.5

## 2020-06-15 NOTE — Discharge Instructions (Signed)
You were seen in the emergency department today after a fall.  Your CT scans and x-rays did not show any new fractures or any brain bleeding.  Please follow attached wound care instructions for your wound injuries.  Take Tylenol per over-the-counter dosing to help with discomfort.  Follow-up with your primary care provider within 1 week.  Return to the ER for new or worsening symptoms including but not limited to increased pain, severe headache, change in your vision, numbness, weakness, chest pain, abdominal pain, blood in your urine or stool, or any other concerns.

## 2020-06-15 NOTE — ED Triage Notes (Signed)
[place] EMS from [place]. Pt fell coming down the concrete steps. Fell 5-6 steps, landed face first on concrete. Pt denies LOC. Pt denies being on blood thinners. Pt has lac to bridge of nose, abrasion to left wrist, hematoma to right cheek. Pt not currently bleeding. Pt got up from EMS stretcher on arrival and walked to ED stretcher.

## 2020-06-15 NOTE — ED Provider Notes (Addendum)
[facility] DEPT Provider Note   CSN: [id number] Arrival date & time: 06/15/20  1510     History Chief Complaint  Patient presents with  . Fall    [name] is a 87 y.o. male with a hx of AAA, GERD, hypercholesterolemia & tobacco use who presents to the ED via EMS with comlaints of wrist and facial pain S/p mechanical fall shortly PTA. Patient states he was exiting a church, walking down concrete steps, and slipped forward falling down 6-7 steps. He struck his face but did not have LOC. He is having pain with abrasions to his face and his bilateral wrists. No significant alleviating/aggravating factors. Denies LOC, neck pain, back pain, chest pain, abdominal pain, dizziness, vomiting, hip pain, or seizure. Not on anticoagulation. Ambulated S/p fall. Unknown last tetanus.   HPI     Past Medical History:  Diagnosis Date  . AAA (abdominal aortic aneurysm) ([place])   . Aortic aneurysm ([place])   . GERD (gastroesophageal reflux disease)   . Hypercholesteremia     Patient Active Problem List   Diagnosis Date Noted  . Atypical chest pain 01/15/2014  . Tobacco abuse 01/15/2014  . GERD (gastroesophageal reflux disease) 01/15/2014  . Hypercholesterolemia 01/15/2014    Past Surgical History:  Procedure Laterality Date  . HERNIA REPAIR    . ORTHOPEDIC SURGERY Left    wrist       Family History  Problem Relation Age of Onset  . Heart disease Mother   . Heart attack Sister     Social History   Tobacco Use  . Smoking status: Current Every Day Smoker    Types: Cigarettes  . Smokeless tobacco: Current User  Substance Use Topics  . Alcohol use: Yes  . Drug use: No    Home Medications Prior to Admission medications   Medication Sig Start Date End Date Taking? Authorizing Provider  acetaminophen (TYLENOL) 500 MG tablet Take 1 tablet (500 mg total) by mouth every 6 (six) hours as needed. 02/23/17   Merryl Hacker, MD  aspirin EC 81 MG  tablet Take 81 mg by mouth daily.    [provider]  pantoprazole (PROTONIX) 40 MG tablet Take 40 mg by mouth daily.    [provider]  simvastatin (ZOCOR) 20 MG tablet Take 20 mg by mouth every evening.    [provider]    Allergies    Codeine  Review of Systems   Review of Systems  Constitutional: Negative for chills and fever.  HENT:       Positive for facial pain  Eyes: Negative for visual disturbance.  Respiratory: Negative for shortness of breath.   Cardiovascular: Negative for chest pain.  Gastrointestinal: Negative for abdominal pain and vomiting.  Musculoskeletal: Positive for arthralgias. Negative for back pain and neck pain.  Skin: Positive for wound.  Neurological: Negative for dizziness, seizures, syncope, speech difficulty and weakness.  All other systems reviewed and are negative.   Physical Exam Updated Vital Signs BP (!) 164/77 (BP Location: Left Arm)   Pulse 70   Temp 97.6 F (36.4 C) (Oral)   Resp 18   SpO2 100%   Physical Exam Vitals and nursing note reviewed.  Constitutional:      General: He is not in acute distress.    Appearance: He is not toxic-appearing.  HENT:     Head: No Battle's sign.     Comments: Patient's anterior nose is abraded, no active bleeding. Abrasion to chin  and R cheek as well.  R inferior periorbital ecchymosis developing.  Tenderness to the nose and the R inferior periorbital region & zygomatic arch.     Ears:     Comments: No hemotympanum.     Nose:     Right Nostril: No septal hematoma.     Left Nostril: No septal hematoma.     Mouth/Throat:     Pharynx: Uvula midline.     Comments: No obvious palpable dental instability.  Eyes:     Extraocular Movements: Extraocular movements intact.     Conjunctiva/sclera: Conjunctivae normal.     Pupils: Pupils are equal, round, and reactive to light.     Comments: PERRL.   Cardiovascular:     Rate and Rhythm: Normal rate and regular rhythm.      Comments: 2+ symmetric radial pulses.  Pulmonary:     Effort: Pulmonary effort is normal.     Breath sounds: Normal breath sounds.  Chest:     Chest wall: No tenderness.  Abdominal:     General: There is no distension.     Palpations: Abdomen is soft.     Tenderness: There is no abdominal tenderness. There is no guarding or rebound.  Musculoskeletal:     Cervical back: Normal range of motion and neck supple. No tenderness.     Comments: Upper extremities: Patient has an abrasion to the radial/ventral aspect of the right wrist as well as to the dorsal 2nd MCP region. He also has an abrasion to the left ventral wrist/thenar eminence. No active bleeding. No SQ tissue exposure. Intact AROM throughout bilateral shoulders, elbows, wrists, and all digits. Tender to abraded areas of the bilateral wrists as well as over the base of the right thumb. No anatomical snuffbox tenderness. Otherwise nontender Back: No midline tenderness or palpable step off.  Lower extremities: No obvious deformities. Able to actively range the hips, knees, and ankles. No focal bony tenderness.   Skin:    General: Skin is warm and dry.  Neurological:     Mental Status: He is alert.     Comments: Alert. Clear speech. Cn III-XII Grossly intact. Sensation grossly intact to bilateral upper/lower extremities. 5/5 symmetric grip strength. Able to perform OK sign, thumbs up, or cross 2nd/3rd digits. Ambulatory.   Psychiatric:        Mood and Affect: Mood normal.        Behavior: Behavior normal.     ED Results / Procedures / Treatments   Labs (all labs ordered are listed, but only abnormal results are displayed) Labs Reviewed - No data to display  EKG None  Radiology DG Wrist Complete Left  Result Date: 06/15/2020 CLINICAL DATA:  Pain status post fall. EXAM: RIGHT WRIST - COMPLETE 3+ VIEW; LEFT WRIST - COMPLETE 3+ VIEW COMPARISON:  None. FINDINGS: There is no acute displaced fracture or dislocation involving either  wrist. There are degenerative changes of both wrists. There is osteopenia. IMPRESSION: No acute displaced fracture or dislocation involving either wrist. Electronically Signed   By: Constance Holster M.D.   On: 06/15/2020 16:45   DG Wrist Complete Right  Result Date: 06/15/2020 CLINICAL DATA:  Pain status post fall. EXAM: RIGHT WRIST - COMPLETE 3+ VIEW; LEFT WRIST - COMPLETE 3+ VIEW COMPARISON:  None. FINDINGS: There is no acute displaced fracture or dislocation involving either wrist. There are degenerative changes of both wrists. There is osteopenia. IMPRESSION: No acute displaced fracture or dislocation involving either wrist. Electronically Signed   By:  Katherine Mantlehristopher  Green M.D.   On: 06/15/2020 16:45   CT Head Wo Contrast  Result Date: 06/15/2020 CLINICAL DATA:  Nasal laceration after falling down steps. EXAM: CT HEAD WITHOUT CONTRAST CT MAXILLOFACIAL WITHOUT CONTRAST CT CERVICAL SPINE WITHOUT CONTRAST TECHNIQUE: Multidetector CT imaging of the head, cervical spine, and maxillofacial structures were performed using the standard protocol without intravenous contrast. Multiplanar CT image reconstructions of the cervical spine and maxillofacial structures were also generated. COMPARISON:  Cervical spine radiographs dated 03/23/2004 FINDINGS: CT HEAD FINDINGS Brain: Mild-to-moderate enlargement of the ventricles and subarachnoid spaces. No intracranial hemorrhage, mass lesion or CT evidence of acute infarction. Vascular: No hyperdense vessel or unexpected calcification. Skull: Normal. Negative for fracture or focal lesion. Other: None. CT MAXILLOFACIAL FINDINGS Osseous: No fractures. Multiple missing teeth. Cavities in multiple right lower teeth. Deviation of the midportion of the nasal septum to the left. Orbits: Negative. No traumatic or inflammatory finding. Sinuses: Clear. Soft tissues: Negative. CT CERVICAL SPINE FINDINGS Alignment: Mild anterolisthesis at the C4-5 level. Minimal retrolisthesis at the  C5-6 and C6-7 levels. Skull base and vertebrae: No acute fracture. No primary bone lesion or focal pathologic process. Soft tissues and spinal canal: No prevertebral fluid or swelling. No visible canal hematoma. Disc levels: Degenerative changes at the C4-5, C5-6 and C6-7 levels. Facet degenerative changes throughout the cervical spine bilaterally. Upper chest: Bilateral upper lobe centrilobular bullous changes and biapical pleural and parenchymal scarring. Other: Mild bilateral carotid artery atheromatous calcifications. IMPRESSION: 1. No skull fracture or intracranial hemorrhage. 2. No maxillofacial fracture. 3. No cervical spine fracture or traumatic subluxation. 4. Mild to moderate diffuse cerebral and cerebellar atrophy. 5. Multilevel cervical spine degenerative changes. 6. Cavities in multiple right lower teeth. 7. COPD. 8. Mild bilateral carotid artery atheromatous calcifications. Emphysema (ICD10-J43.9). Electronically Signed   By: Beckie SaltsSteven  Reid M.D.   On: 06/15/2020 17:32   CT Cervical Spine Wo Contrast  Result Date: 06/15/2020 CLINICAL DATA:  Nasal laceration after falling down steps. EXAM: CT HEAD WITHOUT CONTRAST CT MAXILLOFACIAL WITHOUT CONTRAST CT CERVICAL SPINE WITHOUT CONTRAST TECHNIQUE: Multidetector CT imaging of the head, cervical spine, and maxillofacial structures were performed using the standard protocol without intravenous contrast. Multiplanar CT image reconstructions of the cervical spine and maxillofacial structures were also generated. COMPARISON:  Cervical spine radiographs dated 03/23/2004 FINDINGS: CT HEAD FINDINGS Brain: Mild-to-moderate enlargement of the ventricles and subarachnoid spaces. No intracranial hemorrhage, mass lesion or CT evidence of acute infarction. Vascular: No hyperdense vessel or unexpected calcification. Skull: Normal. Negative for fracture or focal lesion. Other: None. CT MAXILLOFACIAL FINDINGS Osseous: No fractures. Multiple missing teeth. Cavities in  multiple right lower teeth. Deviation of the midportion of the nasal septum to the left. Orbits: Negative. No traumatic or inflammatory finding. Sinuses: Clear. Soft tissues: Negative. CT CERVICAL SPINE FINDINGS Alignment: Mild anterolisthesis at the C4-5 level. Minimal retrolisthesis at the C5-6 and C6-7 levels. Skull base and vertebrae: No acute fracture. No primary bone lesion or focal pathologic process. Soft tissues and spinal canal: No prevertebral fluid or swelling. No visible canal hematoma. Disc levels: Degenerative changes at the C4-5, C5-6 and C6-7 levels. Facet degenerative changes throughout the cervical spine bilaterally. Upper chest: Bilateral upper lobe centrilobular bullous changes and biapical pleural and parenchymal scarring. Other: Mild bilateral carotid artery atheromatous calcifications. IMPRESSION: 1. No skull fracture or intracranial hemorrhage. 2. No maxillofacial fracture. 3. No cervical spine fracture or traumatic subluxation. 4. Mild to moderate diffuse cerebral and cerebellar atrophy. 5. Multilevel cervical spine degenerative changes. 6. Cavities  in multiple right lower teeth. 7. COPD. 8. Mild bilateral carotid artery atheromatous calcifications. Emphysema (ICD10-J43.9). Electronically Signed   By: Claudie Revering M.D.   On: 06/15/2020 17:32   DG Hand Complete Right  Result Date: 06/15/2020 CLINICAL DATA:  Right 1st metacarpal pain after falling down steps. EXAM: RIGHT HAND - COMPLETE 3+ VIEW COMPARISON:  None. FINDINGS: There is no evidence of fracture or dislocation. There is no evidence of arthropathy or other focal bone abnormality. Soft tissues are unremarkable. The patient was unable to remove his ring for the examination. IMPRESSION: Normal examination. Electronically Signed   By: Claudie Revering M.D.   On: 06/15/2020 17:34   CT Maxillofacial WO CM  Result Date: 06/15/2020 CLINICAL DATA:  Nasal laceration after falling down steps. EXAM: CT HEAD WITHOUT CONTRAST CT MAXILLOFACIAL  WITHOUT CONTRAST CT CERVICAL SPINE WITHOUT CONTRAST TECHNIQUE: Multidetector CT imaging of the head, cervical spine, and maxillofacial structures were performed using the standard protocol without intravenous contrast. Multiplanar CT image reconstructions of the cervical spine and maxillofacial structures were also generated. COMPARISON:  Cervical spine radiographs dated 03/23/2004 FINDINGS: CT HEAD FINDINGS Brain: Mild-to-moderate enlargement of the ventricles and subarachnoid spaces. No intracranial hemorrhage, mass lesion or CT evidence of acute infarction. Vascular: No hyperdense vessel or unexpected calcification. Skull: Normal. Negative for fracture or focal lesion. Other: None. CT MAXILLOFACIAL FINDINGS Osseous: No fractures. Multiple missing teeth. Cavities in multiple right lower teeth. Deviation of the midportion of the nasal septum to the left. Orbits: Negative. No traumatic or inflammatory finding. Sinuses: Clear. Soft tissues: Negative. CT CERVICAL SPINE FINDINGS Alignment: Mild anterolisthesis at the C4-5 level. Minimal retrolisthesis at the C5-6 and C6-7 levels. Skull base and vertebrae: No acute fracture. No primary bone lesion or focal pathologic process. Soft tissues and spinal canal: No prevertebral fluid or swelling. No visible canal hematoma. Disc levels: Degenerative changes at the C4-5, C5-6 and C6-7 levels. Facet degenerative changes throughout the cervical spine bilaterally. Upper chest: Bilateral upper lobe centrilobular bullous changes and biapical pleural and parenchymal scarring. Other: Mild bilateral carotid artery atheromatous calcifications. IMPRESSION: 1. No skull fracture or intracranial hemorrhage. 2. No maxillofacial fracture. 3. No cervical spine fracture or traumatic subluxation. 4. Mild to moderate diffuse cerebral and cerebellar atrophy. 5. Multilevel cervical spine degenerative changes. 6. Cavities in multiple right lower teeth. 7. COPD. 8. Mild bilateral carotid artery  atheromatous calcifications. Emphysema (ICD10-J43.9). Electronically Signed   By: Claudie Revering M.D.   On: 06/15/2020 17:32    Procedures Procedures   Medications Ordered in ED Medications - No data to display  ED Course  I have reviewed the triage vital signs and the nursing notes.  Pertinent labs & imaging results that were available during my care of the patient were reviewed by me and considered in my medical decision making (see chart for details).    MDM Rules/Calculators/A&P                          Patient presents to the ED S/p mechanical fall with facial and bilateral wrist injuries. Nontoxic, vitals w/ elevated BP.   Additional history obtained:  Additional history obtained from chat review &  Nursing note review.  Imaging Studies ordered:  I ordered imaging studies which included CT head/Cspine/Maxillofacial & x-rays of the bilateral wrists & R hand, I independently visualized and interpreted imaging which showed no acute injuries.   Abraded areas to face and upper extremities do not appear amenable to suture/stable/skin  adhesive repair. Tetanus updated. No midline spinal tenderness or neuro deficits, reassuring CT head/Cspine, doubt acute fx or head bleed. No chest/abdominal tenderness. MSK x-rays in areas of tenderness negative, NVI distally. Ambulatory. Appears appropriate for discharge home. I discussed results, treatment plan, need for follow-up, and return precautions with the patient. Provided opportunity for questions, patient confirmed understanding and is in agreement with plan.   Findings and plan of care discussed with supervising physician Dr. Gilford Raid who has evaluated patient as shared visit & is in agreement.   Portions of this note were generated with [employer]. Dictation errors may occur despite best attempts at proofreading.  Final Clinical Impression(s) / ED Diagnoses Final diagnoses:  Fall, initial encounter    Rx / DC Orders ED  Discharge Orders    None       Amaryllis Dyke, PA-C 06/15/20 1743    Amaryllis Dyke, PA-C 06/15/20 1751    Isla Pence, MD 06/15/20 [phone]

## 2020-06-16 DIAGNOSIS — R030 Elevated blood-pressure reading, without diagnosis of hypertension: Secondary | ICD-10-CM | POA: Diagnosis not present

## 2020-06-16 DIAGNOSIS — T07XXXA Unspecified multiple injuries, initial encounter: Secondary | ICD-10-CM | POA: Diagnosis not present

## 2020-06-23 DIAGNOSIS — R0781 Pleurodynia: Secondary | ICD-10-CM | POA: Diagnosis not present

## 2020-06-23 DIAGNOSIS — T07XXXA Unspecified multiple injuries, initial encounter: Secondary | ICD-10-CM | POA: Diagnosis not present

## 2020-09-22 ENCOUNTER — Other Ambulatory Visit: Payer: Self-pay | Admitting: Family Medicine

## 2020-09-22 DIAGNOSIS — I714 Abdominal aortic aneurysm, without rupture, unspecified: Secondary | ICD-10-CM

## 2020-10-16 ENCOUNTER — Ambulatory Visit
Admission: RE | Admit: 2020-10-16 | Discharge: 2020-10-16 | Disposition: A | Discharge status: Home / Self Care | Payer: Medicare Other | Source: Ambulatory Visit | Attending: Family Medicine | Admitting: Family Medicine

## 2020-10-16 DIAGNOSIS — I714 Abdominal aortic aneurysm, without rupture, unspecified: Secondary | ICD-10-CM

## 2020-11-08 DIAGNOSIS — J029 Acute pharyngitis, unspecified: Secondary | ICD-10-CM | POA: Diagnosis not present

## 2020-11-08 DIAGNOSIS — H6123 Impacted cerumen, bilateral: Secondary | ICD-10-CM | POA: Diagnosis not present

## 2020-11-10 DIAGNOSIS — J029 Acute pharyngitis, unspecified: Secondary | ICD-10-CM | POA: Diagnosis not present

## 2021-01-26 DIAGNOSIS — B351 Tinea unguium: Secondary | ICD-10-CM | POA: Diagnosis not present

## 2021-01-26 DIAGNOSIS — L03032 Cellulitis of left toe: Secondary | ICD-10-CM | POA: Diagnosis not present

## 2021-02-10 DIAGNOSIS — M79605 Pain in left leg: Secondary | ICD-10-CM | POA: Diagnosis not present

## 2021-02-10 DIAGNOSIS — M79604 Pain in right leg: Secondary | ICD-10-CM | POA: Diagnosis not present

## 2021-02-12 ENCOUNTER — Other Ambulatory Visit: Payer: Self-pay | Admitting: Family Medicine

## 2021-02-12 DIAGNOSIS — M79605 Pain in left leg: Secondary | ICD-10-CM

## 2021-02-12 DIAGNOSIS — M79604 Pain in right leg: Secondary | ICD-10-CM

## 2021-02-17 ENCOUNTER — Ambulatory Visit
Admission: RE | Admit: 2021-02-17 | Discharge: 2021-02-17 | Disposition: A | Discharge status: Home / Self Care | Payer: Medicare Other | Source: Ambulatory Visit | Attending: Family Medicine | Admitting: Family Medicine

## 2021-02-17 DIAGNOSIS — M79605 Pain in left leg: Secondary | ICD-10-CM

## 2021-02-17 DIAGNOSIS — M79604 Pain in right leg: Secondary | ICD-10-CM

## 2021-02-17 DIAGNOSIS — I998 Other disorder of circulatory system: Secondary | ICD-10-CM | POA: Diagnosis not present

## 2021-02-25 DIAGNOSIS — L03032 Cellulitis of left toe: Secondary | ICD-10-CM | POA: Diagnosis not present

## 2021-02-25 DIAGNOSIS — M79672 Pain in left foot: Secondary | ICD-10-CM | POA: Diagnosis not present

## 2021-03-03 ENCOUNTER — Other Ambulatory Visit: Payer: Self-pay

## 2021-03-03 DIAGNOSIS — I739 Peripheral vascular disease, unspecified: Secondary | ICD-10-CM

## 2021-03-11 ENCOUNTER — Ambulatory Visit (HOSPITAL_COMMUNITY)
Admission: RE | Admit: 2021-03-11 | Discharge: 2021-03-11 | Disposition: A | Discharge status: Home / Self Care | Payer: Medicare Other | Source: Ambulatory Visit | Attending: Vascular Surgery | Admitting: Vascular Surgery

## 2021-03-11 ENCOUNTER — Encounter: Payer: Self-pay | Admitting: Vascular Surgery

## 2021-03-11 ENCOUNTER — Other Ambulatory Visit: Payer: Self-pay

## 2021-03-11 ENCOUNTER — Ambulatory Visit: Payer: Medicare Other | Admitting: Vascular Surgery

## 2021-03-11 VITALS — BP 159/75 | HR 66 | Temp 98.3°F | Resp 20 | Ht 69.0 in | Wt 139.0 lb

## 2021-03-11 DIAGNOSIS — I739 Peripheral vascular disease, unspecified: Secondary | ICD-10-CM | POA: Insufficient documentation

## 2021-03-11 NOTE — Progress Notes (Signed)
Patient ID: Justin Justin, male   DOB: Jul 05, 1933, 87 y.o.   MRN: [id number]  Reason for Consult: New Patient (Initial Visit)   Referred by Carol Ada, MD  Subjective:     HPI:  Justin Justin is a 87 y.o. male history of small abdominal aortic aneurysm.  He now presents for evaluation of claudication.  He states that his thighs hurt after walking mostly uphill from his golf cart to his ball.  He rarely notices other times.  He does not have any tissue loss or ulceration recently had an ingrown toenail procedure on the left which is healing well.  He does not have any new back or abdominal pain but does have chronic back pain.  He has no stroke TIA or amaurosis.  He is not taking any antiplatelet agents at this time.  He does take a statin  Past Medical History:  Diagnosis Date   AAA (abdominal aortic aneurysm)    Aortic aneurysm ([facility])    GERD (gastroesophageal reflux disease)    Hypercholesteremia    Family History  Problem Relation Age of Onset   Heart disease Mother    Heart attack Sister    Past Surgical History:  Procedure Laterality Date   HERNIA REPAIR     ORTHOPEDIC SURGERY Left    wrist    Short Social History:  Social History   Tobacco Use   Smoking status: Every Day    Packs/day: 0.50    Types: Cigarettes   Smokeless tobacco: Current  Substance Use Topics   Alcohol use: Yes    Allergies  Allergen Reactions   Codeine     Hallucinations    Current Outpatient Medications  Medication Sig Dispense Refill   pantoprazole (PROTONIX) 40 MG tablet Take 40 mg by mouth daily.     simvastatin (ZOCOR) 20 MG tablet Take 20 mg by mouth every evening.     No current facility-administered medications for this visit.    Review of Systems  Constitutional:  Constitutional negative. HENT: HENT negative.  Eyes: Eyes negative.  Respiratory: Respiratory negative.  Cardiovascular: Positive for leg swelling.  GI: Gastrointestinal negative.  Musculoskeletal:  Positive for back pain and leg pain.  Skin: Skin negative.  Neurological: Neurological negative. Hematologic: Hematologic/lymphatic negative.  Psychiatric: Psychiatric negative.       Objective:  Objective   Vitals:   03/11/21 1525  BP: (!) 159/75  Pulse: 66  Resp: 20  Temp: 98.3 F (36.8 C)  SpO2: 95%  Weight: 139 lb (63 kg)  Height: 5\' 9"  (1.753 m)   Body mass index is 20.53 kg/m.  Physical Exam HENT:     Head: Normocephalic.     Nose:     Comments: Wearing a mask Eyes:     Pupils: Pupils are equal, round, and reactive to light.  Neck:     Vascular: No carotid bruit.  Cardiovascular:     Rate and Rhythm: Normal rate.     Pulses:          Radial pulses are 2+ on the right side and 2+ on the left side.       Femoral pulses are 2+ on the right side and 2+ on the left side.      Popliteal pulses are 2+ on the right side and 2+ on the left side.       Dorsalis pedis pulses are 0 on the right side and 2+ on the left side.  Posterior tibial pulses are 0 on the right side and 0 on the left side.  Pulmonary:     Effort: Pulmonary effort is normal.     Breath sounds: Normal breath sounds.  Abdominal:     General: Abdomen is flat.     Palpations: Abdomen is soft. There is no mass.  Skin:    General: Skin is warm and dry.     Capillary Refill: Capillary refill takes less than 2 seconds.  Neurological:     Mental Status: He is alert.    Data: ABI Findings:  +---------+------------------+-----+--------+------------+  Right    Rt Pressure (mmHg)IndexWaveformComment       +---------+------------------+-----+--------+------------+  Brachial 142                                          +---------+------------------+-----+--------+------------+  PTA      130               0.86 biphasic              +---------+------------------+-----+--------+------------+  DP       128               0.85 biphasic               +---------+------------------+-----+--------+------------+  Great Toe52                0.34 Abnormaldampened PPG  +---------+------------------+-----+--------+------------+   +---------+------------------+-----+---------+-------+  Left     Lt Pressure (mmHg)IndexWaveform Comment  +---------+------------------+-----+---------+-------+  Brachial 151                                      +---------+------------------+-----+---------+-------+  PTA      154               1.02 triphasic         +---------+------------------+-----+---------+-------+  DP       152               1.01 biphasic          +---------+------------------+-----+---------+-------+  Great Toe104               0.69                   +---------+------------------+-----+---------+-------+   +-------+-----------+-----------+------------+------------+  ABI/TBIToday's ABIToday's TBIPrevious ABIPrevious TBI  +-------+-----------+-----------+------------+------------+  Right  0.86       0.34       0.87        0.47          +-------+-----------+-----------+------------+------------+  Left   1.02       0.69       1.00        0.49          +-------+-----------+-----------+------------+------------+          Assessment/Plan:     87 year old male with small abdominal aortic aneurysm mildly decreased ABIs on the right relative to the left with thigh pain with walking.  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  Otherwise do not think his symptoms are from arterial insufficiency.  I have asked him to initiate baby aspirin daily.  He will follow-up  in 1 year with repeat ABIs and we can evaluate his aneurysm at that time as well.     Waynetta Sandy MD [facility]

## 2021-06-18 DIAGNOSIS — K219 Gastro-esophageal reflux disease without esophagitis: Secondary | ICD-10-CM | POA: Diagnosis not present

## 2021-06-18 DIAGNOSIS — Z1389 Encounter for screening for other disorder: Secondary | ICD-10-CM | POA: Diagnosis not present

## 2021-06-18 DIAGNOSIS — E785 Hyperlipidemia, unspecified: Secondary | ICD-10-CM | POA: Diagnosis not present

## 2021-06-18 DIAGNOSIS — Z0000 Encounter for general adult medical examination without abnormal findings: Secondary | ICD-10-CM | POA: Diagnosis not present

## 2021-06-18 DIAGNOSIS — Z1159 Encounter for screening for other viral diseases: Secondary | ICD-10-CM | POA: Diagnosis not present

## 2021-06-18 DIAGNOSIS — I700 Atherosclerosis of aorta: Secondary | ICD-10-CM | POA: Diagnosis not present

## 2021-06-18 DIAGNOSIS — R058 Other specified cough: Secondary | ICD-10-CM | POA: Diagnosis not present

## 2021-06-18 DIAGNOSIS — Z23 Encounter for immunization: Secondary | ICD-10-CM | POA: Diagnosis not present

## 2021-06-18 DIAGNOSIS — G4700 Insomnia, unspecified: Secondary | ICD-10-CM | POA: Diagnosis not present

## 2021-08-03 DIAGNOSIS — K4021 Bilateral inguinal hernia, without obstruction or gangrene, recurrent: Secondary | ICD-10-CM | POA: Diagnosis not present

## 2021-08-03 DIAGNOSIS — R1031 Right lower quadrant pain: Secondary | ICD-10-CM | POA: Diagnosis not present

## 2021-08-03 DIAGNOSIS — R1030 Lower abdominal pain, unspecified: Secondary | ICD-10-CM | POA: Diagnosis not present

## 2021-08-04 ENCOUNTER — Other Ambulatory Visit: Payer: Self-pay | Admitting: Family Medicine

## 2021-08-04 DIAGNOSIS — R1030 Lower abdominal pain, unspecified: Secondary | ICD-10-CM

## 2021-08-26 ENCOUNTER — Ambulatory Visit
Admission: RE | Admit: 2021-08-26 | Discharge: 2021-08-26 | Disposition: A | Discharge status: Home / Self Care | Payer: Medicare Other | Source: Ambulatory Visit | Attending: Family Medicine | Admitting: Family Medicine

## 2021-08-26 DIAGNOSIS — I724 Aneurysm of artery of lower extremity: Secondary | ICD-10-CM | POA: Diagnosis not present

## 2021-08-26 DIAGNOSIS — R1030 Lower abdominal pain, unspecified: Secondary | ICD-10-CM

## 2021-08-26 DIAGNOSIS — I723 Aneurysm of iliac artery: Secondary | ICD-10-CM | POA: Diagnosis not present

## 2021-08-26 DIAGNOSIS — K5730 Diverticulosis of large intestine without perforation or abscess without bleeding: Secondary | ICD-10-CM | POA: Diagnosis not present

## 2021-08-26 MED ORDER — IOPAMIDOL (ISOVUE-300) INJECTION 61%
100.0000 mL | Freq: Once | INTRAVENOUS | Status: AC | PRN
  Administered 2021-08-26: 100 mL via INTRAVENOUS

## 2021-10-07 DIAGNOSIS — X32XXXD Exposure to sunlight, subsequent encounter: Secondary | ICD-10-CM | POA: Diagnosis not present

## 2021-10-07 DIAGNOSIS — L570 Actinic keratosis: Secondary | ICD-10-CM | POA: Diagnosis not present

## 2021-10-07 DIAGNOSIS — C44229 Squamous cell carcinoma of skin of left ear and external auricular canal: Secondary | ICD-10-CM | POA: Diagnosis not present

## 2021-10-07 DIAGNOSIS — C4442 Squamous cell carcinoma of skin of scalp and neck: Secondary | ICD-10-CM | POA: Diagnosis not present

## 2021-12-01 DIAGNOSIS — Z08 Encounter for follow-up examination after completed treatment for malignant neoplasm: Secondary | ICD-10-CM | POA: Diagnosis not present

## 2021-12-01 DIAGNOSIS — Z85828 Personal history of other malignant neoplasm of skin: Secondary | ICD-10-CM | POA: Diagnosis not present

## 2022-01-12 DIAGNOSIS — K219 Gastro-esophageal reflux disease without esophagitis: Secondary | ICD-10-CM | POA: Diagnosis not present

## 2022-02-24 DIAGNOSIS — Z23 Encounter for immunization: Secondary | ICD-10-CM | POA: Diagnosis not present

## 2022-03-09 DIAGNOSIS — R29898 Other symptoms and signs involving the musculoskeletal system: Secondary | ICD-10-CM | POA: Diagnosis not present

## 2022-03-09 DIAGNOSIS — F17200 Nicotine dependence, unspecified, uncomplicated: Secondary | ICD-10-CM | POA: Diagnosis not present

## 2022-03-15 ENCOUNTER — Other Ambulatory Visit: Payer: Self-pay | Admitting: *Deleted

## 2022-03-15 DIAGNOSIS — M9904 Segmental and somatic dysfunction of sacral region: Secondary | ICD-10-CM | POA: Diagnosis not present

## 2022-03-15 DIAGNOSIS — M9905 Segmental and somatic dysfunction of pelvic region: Secondary | ICD-10-CM | POA: Diagnosis not present

## 2022-03-15 DIAGNOSIS — M9903 Segmental and somatic dysfunction of lumbar region: Secondary | ICD-10-CM | POA: Diagnosis not present

## 2022-03-15 DIAGNOSIS — M5136 Other intervertebral disc degeneration, lumbar region: Secondary | ICD-10-CM | POA: Diagnosis not present

## 2022-03-15 DIAGNOSIS — I739 Peripheral vascular disease, unspecified: Secondary | ICD-10-CM

## 2022-03-19 NOTE — Progress Notes (Unsigned)
Office Note     CC:  follow up Requesting Provider:  Carol Ada, MD  HPI: [name] is a 88 y.o. (28-May-1933) male who presents for surveillance follow up of PAD and AAA. He recently referred back to our office for decreased ABI and RLE weakness. He was last seen in October of 2022 by Dr. Donzetta Matters. At that time he was having right thigh pain after walking mostly uphill.  He did not have any tissue loss or ulceration. He did not have any new back or abdominal pain but does have chronic back pain. His non invasive studies indicated some likely mild tibial disease on the right but Dr. Donzetta Matters did not feel that his symptoms were entirely from his arterial disease and more so multifactorial. He explains that over past *** his right leg discomfort has progressed and he also has weakness in the right leg.   The pt is on a statin for cholesterol management.  The pt is on a daily aspirin.   Other AC:  none The pt is not on medicaiton for hypertension.   The pt is not diabetic.   Tobacco hx:  current, 1/2 ppd  Past Medical History:  Diagnosis Date   AAA (abdominal aortic aneurysm)    Aortic aneurysm (HCC)    GERD (gastroesophageal reflux disease)    Hypercholesteremia     Past Surgical History:  Procedure Laterality Date   HERNIA REPAIR     ORTHOPEDIC SURGERY Left    wrist    Social History   Socioeconomic History   Marital status: Married    Spouse name: Not on file   Number of children: 2   Years of education: Not on file   Highest education level: Not on file  Occupational History   Not on file  Tobacco Use   Smoking status: Every Day    Packs/day: 0.50    Types: Cigarettes   Smokeless tobacco: Current  Vaping Use   Vaping Use: Never used  Substance and Sexual Activity   Alcohol use: Yes   Drug use: No   Sexual activity: Not on file  Other Topics Concern   Not on file  Social History Narrative   Not on file   Social Determinants of Health   Financial Resource  Strain: Not on file  Food Insecurity: Not on file  Transportation Needs: Not on file  Physical Activity: Not on file  Stress: Not on file  Social Connections: Not on file  Intimate Partner Violence: Not on file    Family History  Problem Relation Age of Onset   Heart disease Mother    Heart attack Sister     Current Outpatient Medications  Medication Sig Dispense Refill   pantoprazole (PROTONIX) 40 MG tablet Take 40 mg by mouth daily.     simvastatin (ZOCOR) 20 MG tablet Take 20 mg by mouth every evening.     No current facility-administered medications for this visit.    Allergies  Allergen Reactions   Codeine     Hallucinations     REVIEW OF SYSTEMS:   '[X]'$  denotes positive finding, '[ ]'$  denotes negative finding Cardiac  Comments:  Chest pain or chest pressure:    Shortness of breath upon exertion:    Short of breath when lying flat:    Irregular heart rhythm:        Vascular    Pain in calf, thigh, or hip brought on by ambulation:    Pain in feet at  night that wakes you up from your sleep:     Blood clot in your veins:    Leg swelling:         Pulmonary    Oxygen at home:    Productive cough:     Wheezing:         Neurologic    Sudden weakness in arms or legs:     Sudden numbness in arms or legs:     Sudden onset of difficulty speaking or slurred speech:    Temporary loss of vision in one eye:     Problems with dizziness:         Gastrointestinal    Blood in stool:     Vomited blood:         Genitourinary    Burning when urinating:     Blood in urine:        Psychiatric    Major depression:         Hematologic    Bleeding problems:    Problems with blood clotting too easily:        Skin    Rashes or ulcers:        Constitutional    Fever or chills:      PHYSICAL EXAMINATION:  There were no vitals filed for this visit.  General:  WDWN in NAD; vital signs documented above Gait: Not observed HENT: WNL, normocephalic Pulmonary: normal  non-labored breathing , without Rales, rhonchi,  wheezing Cardiac: {Desc; regular/irreg:14544} HR, without  Murmurs {With/Without:20273} carotid bruit*** Abdomen: soft, NT, no masses Skin: {With/Without:20273} rashes Vascular Exam/Pulses:  Right Left  Radial {Exam; arterial pulse strength 0-4:30167} {Exam; arterial pulse strength 0-4:30167}  Ulnar {Exam; arterial pulse strength 0-4:30167} {Exam; arterial pulse strength 0-4:30167}  Femoral {Exam; arterial pulse strength 0-4:30167} {Exam; arterial pulse strength 0-4:30167}  Popliteal {Exam; arterial pulse strength 0-4:30167} {Exam; arterial pulse strength 0-4:30167}  DP {Exam; arterial pulse strength 0-4:30167} {Exam; arterial pulse strength 0-4:30167}  PT {Exam; arterial pulse strength 0-4:30167} {Exam; arterial pulse strength 0-4:30167}   Extremities: {With/Without:20273} ischemic changes, {With/Without:20273} Gangrene , {With/Without:20273} cellulitis; {With/Without:20273} open wounds;  Musculoskeletal: no muscle wasting or atrophy  Neurologic: A&O X 3;  No focal weakness or paresthesias are detected Psychiatric:  The pt has {Desc; normal/abnormal:11317::"Normal"} affect.   Non-Invasive Vascular Imaging:   ***    ASSESSMENT/PLAN:: 88 y.o. male here for follow up for ***   -***   Karoline Caldwell, PA-C [facility] MD:  Trula Slade

## 2022-03-22 ENCOUNTER — Ambulatory Visit: Payer: Medicare Other | Admitting: Physician Assistant

## 2022-03-22 ENCOUNTER — Ambulatory Visit (INDEPENDENT_AMBULATORY_CARE_PROVIDER_SITE_OTHER)
Admission: RE | Admit: 2022-03-22 | Discharge: 2022-03-22 | Disposition: A | Discharge status: Home / Self Care | Payer: Medicare Other | Source: Ambulatory Visit | Attending: Surgery | Admitting: Surgery

## 2022-03-22 ENCOUNTER — Ambulatory Visit (HOSPITAL_COMMUNITY)
Admission: RE | Admit: 2022-03-22 | Discharge: 2022-03-22 | Disposition: A | Discharge status: Home / Self Care | Payer: Medicare Other | Source: Ambulatory Visit | Attending: Surgery | Admitting: Surgery

## 2022-03-22 VITALS — BP 151/69 | HR 63 | Temp 98.0°F | Resp 20 | Ht 69.0 in | Wt 136.8 lb

## 2022-03-22 DIAGNOSIS — I714 Abdominal aortic aneurysm, without rupture, unspecified: Secondary | ICD-10-CM

## 2022-03-22 DIAGNOSIS — I739 Peripheral vascular disease, unspecified: Secondary | ICD-10-CM

## 2022-03-22 DIAGNOSIS — F17200 Nicotine dependence, unspecified, uncomplicated: Secondary | ICD-10-CM | POA: Diagnosis not present

## 2022-03-25 ENCOUNTER — Other Ambulatory Visit: Payer: Self-pay

## 2022-03-25 DIAGNOSIS — I6523 Occlusion and stenosis of bilateral carotid arteries: Secondary | ICD-10-CM

## 2022-04-02 ENCOUNTER — Other Ambulatory Visit: Payer: Self-pay | Admitting: Physician Assistant

## 2022-04-02 DIAGNOSIS — M6281 Muscle weakness (generalized): Secondary | ICD-10-CM

## 2022-04-02 DIAGNOSIS — R202 Paresthesia of skin: Secondary | ICD-10-CM

## 2022-04-05 ENCOUNTER — Ambulatory Visit (HOSPITAL_COMMUNITY): Payer: Medicare Other

## 2022-04-05 ENCOUNTER — Ambulatory Visit: Payer: Medicare Other

## 2022-04-06 DIAGNOSIS — R058 Other specified cough: Secondary | ICD-10-CM | POA: Diagnosis not present

## 2022-04-14 ENCOUNTER — Ambulatory Visit (HOSPITAL_COMMUNITY)
Admission: RE | Admit: 2022-04-14 | Discharge: 2022-04-14 | Disposition: A | Discharge status: Home / Self Care | Payer: Medicare Other | Source: Ambulatory Visit | Attending: Surgery | Admitting: Surgery

## 2022-04-14 ENCOUNTER — Ambulatory Visit: Payer: Medicare Other | Admitting: Physician Assistant

## 2022-04-14 VITALS — BP 177/79 | HR 62 | Temp 97.8°F | Ht 69.0 in | Wt 140.0 lb

## 2022-04-14 DIAGNOSIS — I6523 Occlusion and stenosis of bilateral carotid arteries: Secondary | ICD-10-CM

## 2022-04-14 NOTE — Progress Notes (Signed)
Office Note   History of Present Illness   [name] is a 88 y.o. (03-13-1934) male who presents for surveillance of of carotid artery stenosis.  He has a history of PAD and AAA.  His AAA was first measured at 3.8 cm, and his most recent measurement 2 weeks ago was 3.5 cm at the largest diameter.  His ABIs are abnormal, however they have not changed recently.  He does not have any claudication, rest pain, or tissue loss.  He takes a aspirin 81 mg daily.  At his last visit with [place], he reported an episode of sudden right leg weakness and numbness.  He was brought back today for initial carotid artery duplex.  He has no previous history of CVA or TIA.   He denies any other episodes of weakness or numbness, sudden vision changes, or slurred speech.  Current Outpatient Medications  Medication Sig Dispense Refill   aspirin EC 81 MG tablet Take 81 mg by mouth daily. Swallow whole.     pantoprazole (PROTONIX) 40 MG tablet Take 40 mg by mouth daily.     simvastatin (ZOCOR) 20 MG tablet Take 20 mg by mouth every evening.     No current facility-administered medications for this visit.    REVIEW OF SYSTEMS (negative unless checked):   Cardiac:  '[]'$  Chest pain or chest pressure? '[]'$  Shortness of breath upon activity? '[]'$  Shortness of breath when lying flat? '[]'$  Irregular heart rhythm?  Vascular:  '[]'$  Pain in calf, thigh, or hip brought on by walking? '[]'$  Pain in feet at night that wakes you up from your sleep? '[]'$  Blood clot in your veins? '[]'$  Leg swelling?  Pulmonary:  '[]'$  Oxygen at home? '[]'$  Productive cough? '[]'$  Wheezing?  Neurologic:  '[]'$  Sudden weakness in arms or legs? '[]'$  Sudden numbness in arms or legs? '[]'$  Sudden onset of difficult speaking or slurred speech? '[]'$  Temporary loss of vision in one eye? '[]'$  Problems with dizziness?  Gastrointestinal:  '[]'$  Blood in stool? '[]'$  Vomited blood?  Genitourinary:  '[]'$  Burning when urinating? '[]'$  Blood in urine?  Psychiatric:  '[]'$  Major  depression  Hematologic:  '[]'$  Bleeding problems? '[]'$  Problems with blood clotting?  Dermatologic:  '[]'$  Rashes or ulcers?  Constitutional:  '[]'$  Fever or chills?  Ear/Nose/Throat:  '[]'$  Change in hearing? '[]'$  Nose bleeds? '[]'$  Sore throat?  Musculoskeletal:  '[]'$  Back pain? '[]'$  Joint pain? '[]'$  Muscle pain?   Physical Examination   Vitals:   04/14/22 0903 04/14/22 0904  BP: (!) 157/82 (!) 177/79  Pulse: 62   Temp: 97.8 F (36.6 C)   TempSrc: Temporal   SpO2: 100%   Weight: 140 lb (63.5 kg)   Height: '5\' 9"'$  (1.753 m)    Body mass index is 20.67 kg/m.  General:  WDWN in NAD; vital signs documented above Gait: Not observed HENT: WNL, normocephalic Pulmonary: normal non-labored breathing , without Rales, rhonchi,  wheezing Cardiac: regular HR, without murmurs or carotid bruit Abdomen: soft, NT, no masses Skin: without rashes Vascular Exam/Pulses: 1+ PT pulses bilaterally.  2+ radial pulses bilaterally Extremities: Without ischemic changes or open wounds Musculoskeletal: no muscle wasting or atrophy  Neurologic: A&O X 3;  No focal weakness or paresthesias are detected.  No slurred speech Psychiatric:  The pt has Normal affect.  Non-Invasive Vascular Imaging   B Carotid Duplex (04/14/2022):  R ICA stenosis:  1-39% R VA:  patent and antegrade L ICA stenosis:  1-39% L VA:  patent and antegrade  Medical Decision Making   [name] is a 88 y.o. male who presents for initial carotid artery stenosis evaluation  Based on the patient's duplex study, he has evidence of 1 to 39% internal carotid artery stenosis bilaterally.  Right peak systolic velocity in the ICA of 56.  Left peak systolic velocity in the ICA of 80 cm/s. He did have an episode about a month ago of sudden right leg weakness and numbness with no prior history of CVA or TIA.  He denies any other episodes since then.  Given that the patient's left ICA stenosis is mild, I do not suspect that his concerning  neurologic event is related to his carotid stenosis. At this time we will continue to monitor his symptoms.  He will follow-up with [place] in 2 years with repeat bilateral carotid artery duplex, ABIs, and AAA duplex.  He will call us if he experiences any other symptoms I have also encouraged him to schedule an appointment with his PCP to address his unmedicated hypertension   Vicente Serene PA-C [facility] Office: [facility] MD: Donzetta Matters

## 2022-05-20 IMAGING — CT CT HEAD W/O CM
3 series · 14 of 45 positions shown, 16 images · non-contrast
Comparison: Cervical spine radiographs dated 03/23/2004

CLINICAL DATA: Nasal laceration after falling down steps.

EXAM:
CT HEAD WITHOUT CONTRAST
CT MAXILLOFACIAL WITHOUT CONTRAST
CT CERVICAL SPINE WITHOUT CONTRAST
TECHNIQUE: Multidetector CT imaging of the head, cervical spine, and
maxillofacial structures were performed using the standard protocol
without intravenous contrast. Multiplanar CT image reconstructions
of the cervical spine and maxillofacial structures were also
generated.

[Series 3: head wo · axial · 0.53mm/px · z∈[+0,+115]mm · 8 of 28 slices shown, 10 images]
[im 3/28  brain]
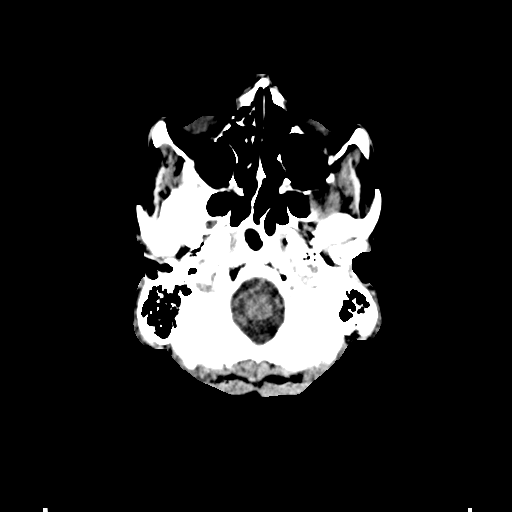
[im 3/28  bone]
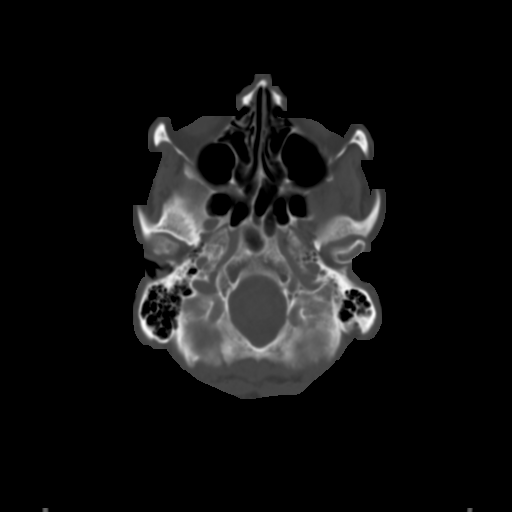
[im 6/28  brain]
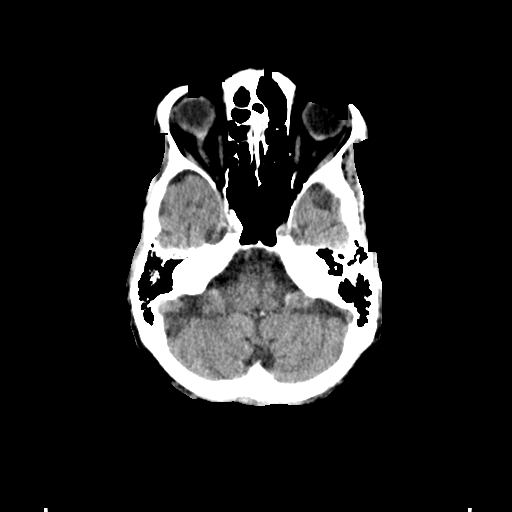
[im 10/28  brain]
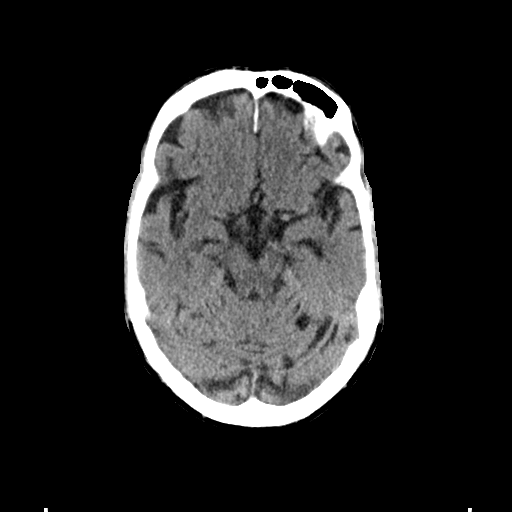
[im 13/28  brain]
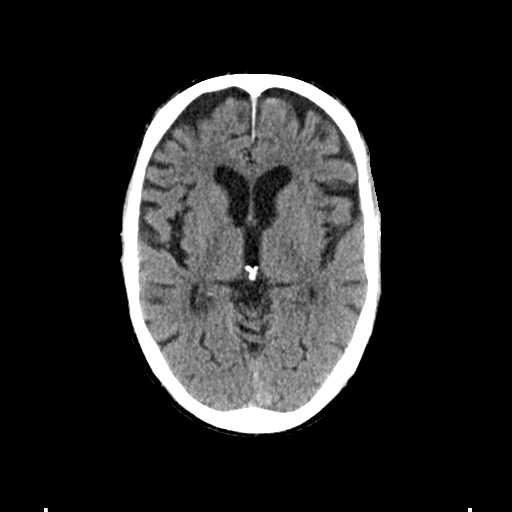
[im 16/28  brain]
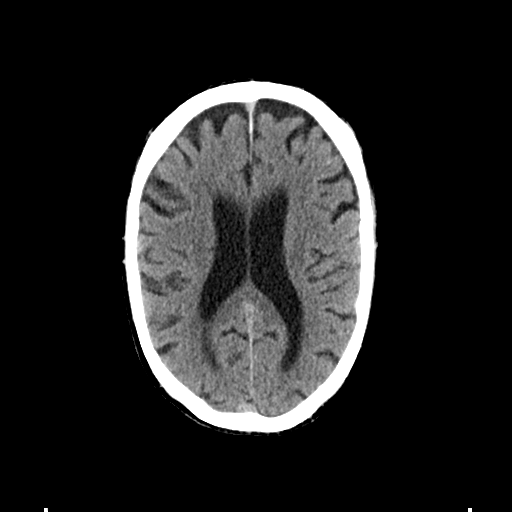
[im 16/28  bone]
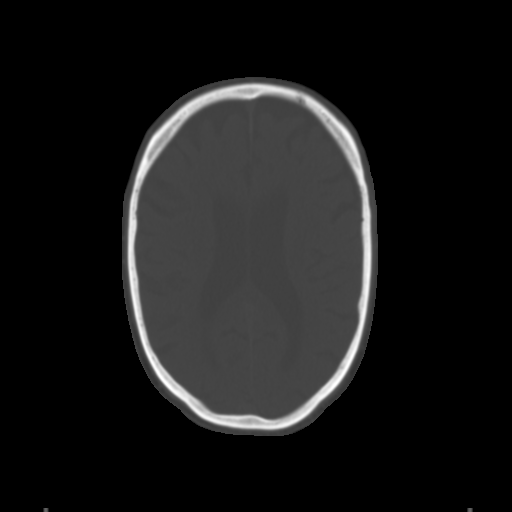
[im 19/28  brain]
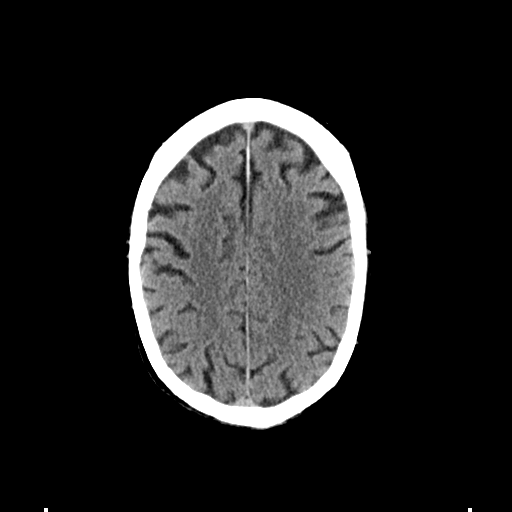
[im 23/28  brain]
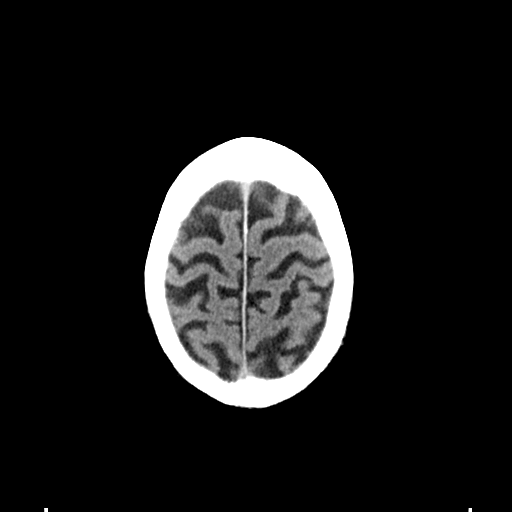
[im 26/28  brain]
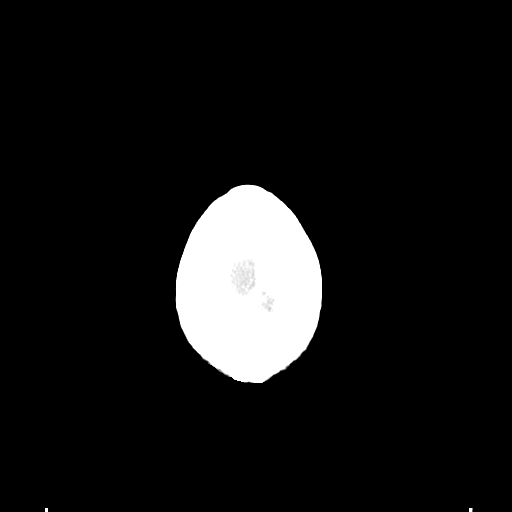

[Series 6: coronal soft tissue · coronal · 0.31mm/px · 3 of 68 slices shown]
[im 23/68  brain]
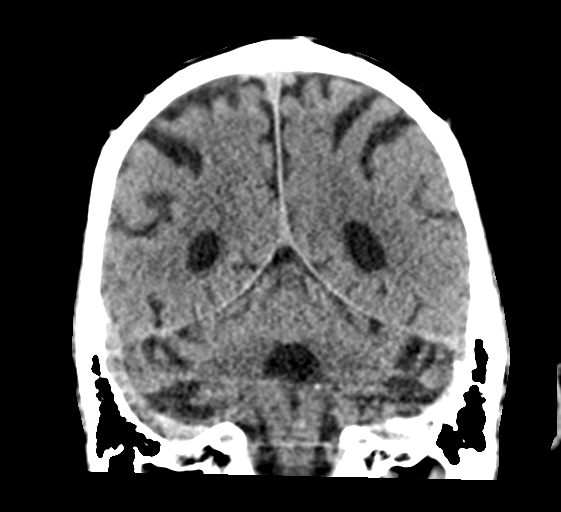
[im 30/68  brain]
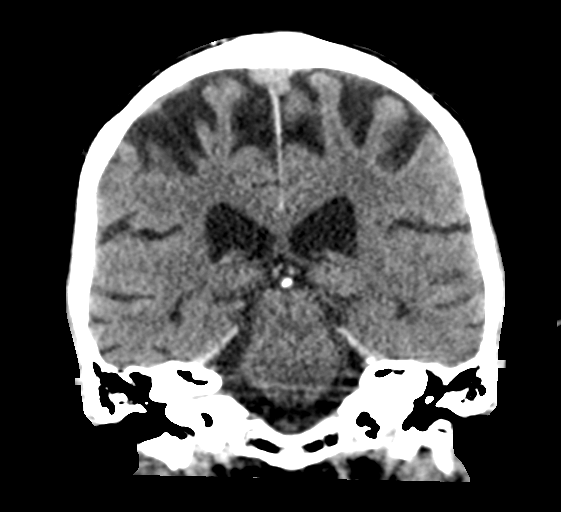
[im 38/68  brain]
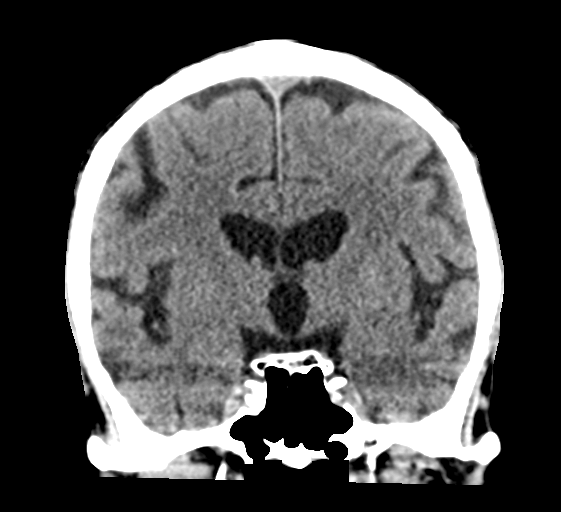

[Series 7: sagittal soft tissue · sagittal · 0.31mm/px · 3 of 56 slices shown]
[im 19/56  brain]
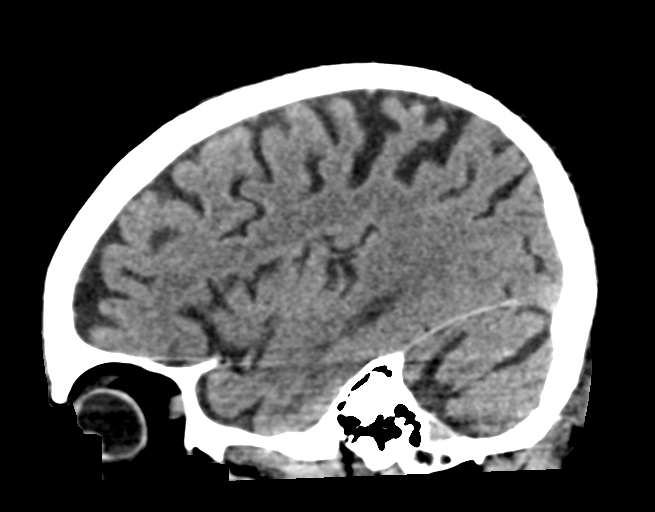
[im 28/56  brain]
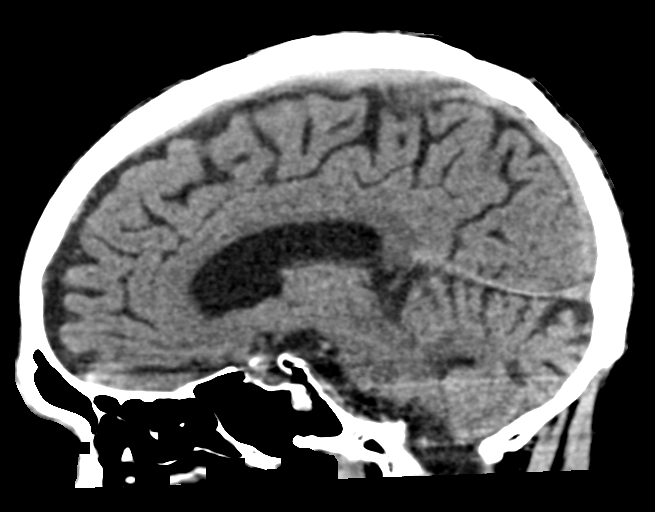
[im 37/56  brain]
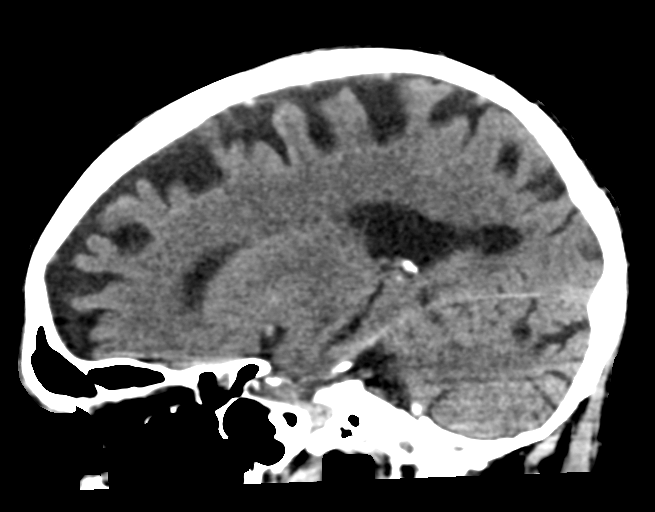

[14 of 45 positions shown; findings below may reference images not displayed]

FINDINGS: CT HEAD FINDINGS

Brain: Mild-to-moderate enlargement of the ventricles and
subarachnoid spaces. No intracranial hemorrhage, mass lesion or CT
evidence of acute infarction.

Vascular: No hyperdense vessel or unexpected calcification.

Skull: Normal. Negative for fracture or focal lesion.

Other: None.

CT MAXILLOFACIAL FINDINGS

Osseous: No fractures. Multiple missing teeth. Cavities in multiple
right lower teeth. Deviation of the midportion of the nasal septum
to the left.

Orbits: Negative. No traumatic or inflammatory finding.

Sinuses: Clear.

Soft tissues: Negative.

CT CERVICAL SPINE FINDINGS

Alignment: Mild anterolisthesis at the C4-5 level. Minimal
retrolisthesis at the C5-6 and C6-7 levels.

Skull base and vertebrae: No acute fracture. No primary bone lesion
or focal pathologic process.

Soft tissues and spinal canal: No prevertebral fluid or swelling. No
visible canal hematoma.

Disc levels: Degenerative changes at the C4-5, C5-6 and C6-7 levels.
Facet degenerative changes throughout the cervical spine
bilaterally.

Upper chest: Bilateral upper lobe centrilobular bullous changes and
biapical pleural and parenchymal scarring.

Other: Mild bilateral carotid artery atheromatous calcifications.
IMPRESSION: 1. No skull fracture or intracranial hemorrhage.
2. No maxillofacial fracture.
3. No cervical spine fracture or traumatic subluxation.
4. Mild to moderate diffuse cerebral and cerebellar atrophy.
5. Multilevel cervical spine degenerative changes.
6. Cavities in multiple right lower teeth.
7. COPD.
8. Mild bilateral carotid artery atheromatous calcifications.

Emphysema ([id number]).

## 2022-05-24 DIAGNOSIS — R053 Chronic cough: Secondary | ICD-10-CM | POA: Diagnosis not present

## 2022-07-28 DIAGNOSIS — B351 Tinea unguium: Secondary | ICD-10-CM | POA: Diagnosis not present

## 2022-07-28 DIAGNOSIS — M2041 Other hammer toe(s) (acquired), right foot: Secondary | ICD-10-CM | POA: Diagnosis not present

## 2022-07-28 DIAGNOSIS — M79671 Pain in right foot: Secondary | ICD-10-CM | POA: Diagnosis not present

## 2022-07-28 DIAGNOSIS — M2042 Other hammer toe(s) (acquired), left foot: Secondary | ICD-10-CM | POA: Diagnosis not present

## 2022-07-28 DIAGNOSIS — M79672 Pain in left foot: Secondary | ICD-10-CM | POA: Diagnosis not present

## 2022-08-23 DIAGNOSIS — M2042 Other hammer toe(s) (acquired), left foot: Secondary | ICD-10-CM | POA: Diagnosis not present

## 2022-08-23 DIAGNOSIS — L03032 Cellulitis of left toe: Secondary | ICD-10-CM | POA: Diagnosis not present

## 2022-08-23 DIAGNOSIS — M79672 Pain in left foot: Secondary | ICD-10-CM | POA: Diagnosis not present

## 2022-08-23 DIAGNOSIS — B351 Tinea unguium: Secondary | ICD-10-CM | POA: Diagnosis not present

## 2022-08-23 DIAGNOSIS — M79671 Pain in right foot: Secondary | ICD-10-CM | POA: Diagnosis not present

## 2022-08-23 DIAGNOSIS — M2041 Other hammer toe(s) (acquired), right foot: Secondary | ICD-10-CM | POA: Diagnosis not present

## 2022-09-21 DIAGNOSIS — M9905 Segmental and somatic dysfunction of pelvic region: Secondary | ICD-10-CM | POA: Diagnosis not present

## 2022-09-21 DIAGNOSIS — M9903 Segmental and somatic dysfunction of lumbar region: Secondary | ICD-10-CM | POA: Diagnosis not present

## 2022-09-21 DIAGNOSIS — M9904 Segmental and somatic dysfunction of sacral region: Secondary | ICD-10-CM | POA: Diagnosis not present

## 2022-09-21 DIAGNOSIS — M5136 Other intervertebral disc degeneration, lumbar region: Secondary | ICD-10-CM | POA: Diagnosis not present

## 2022-09-23 DIAGNOSIS — M9905 Segmental and somatic dysfunction of pelvic region: Secondary | ICD-10-CM | POA: Diagnosis not present

## 2022-09-23 DIAGNOSIS — M5136 Other intervertebral disc degeneration, lumbar region: Secondary | ICD-10-CM | POA: Diagnosis not present

## 2022-09-23 DIAGNOSIS — M9904 Segmental and somatic dysfunction of sacral region: Secondary | ICD-10-CM | POA: Diagnosis not present

## 2022-09-23 DIAGNOSIS — M9903 Segmental and somatic dysfunction of lumbar region: Secondary | ICD-10-CM | POA: Diagnosis not present

## 2022-10-04 DIAGNOSIS — M2041 Other hammer toe(s) (acquired), right foot: Secondary | ICD-10-CM | POA: Diagnosis not present

## 2022-10-04 DIAGNOSIS — B351 Tinea unguium: Secondary | ICD-10-CM | POA: Diagnosis not present

## 2022-10-04 DIAGNOSIS — M79671 Pain in right foot: Secondary | ICD-10-CM | POA: Diagnosis not present

## 2022-10-04 DIAGNOSIS — L03031 Cellulitis of right toe: Secondary | ICD-10-CM | POA: Diagnosis not present

## 2022-10-04 DIAGNOSIS — M79672 Pain in left foot: Secondary | ICD-10-CM | POA: Diagnosis not present

## 2022-10-04 DIAGNOSIS — M2042 Other hammer toe(s) (acquired), left foot: Secondary | ICD-10-CM | POA: Diagnosis not present

## 2022-10-21 DIAGNOSIS — N3281 Overactive bladder: Secondary | ICD-10-CM | POA: Diagnosis not present

## 2022-10-21 DIAGNOSIS — G4700 Insomnia, unspecified: Secondary | ICD-10-CM | POA: Diagnosis not present

## 2022-10-21 DIAGNOSIS — E785 Hyperlipidemia, unspecified: Secondary | ICD-10-CM | POA: Diagnosis not present

## 2022-10-21 DIAGNOSIS — I6523 Occlusion and stenosis of bilateral carotid arteries: Secondary | ICD-10-CM | POA: Diagnosis not present

## 2022-10-21 DIAGNOSIS — I700 Atherosclerosis of aorta: Secondary | ICD-10-CM | POA: Diagnosis not present

## 2022-10-21 DIAGNOSIS — I7143 Infrarenal abdominal aortic aneurysm, without rupture: Secondary | ICD-10-CM | POA: Diagnosis not present

## 2022-10-21 DIAGNOSIS — F17210 Nicotine dependence, cigarettes, uncomplicated: Secondary | ICD-10-CM | POA: Diagnosis not present

## 2022-10-21 DIAGNOSIS — K219 Gastro-esophageal reflux disease without esophagitis: Secondary | ICD-10-CM | POA: Diagnosis not present

## 2022-10-21 DIAGNOSIS — Z0000 Encounter for general adult medical examination without abnormal findings: Secondary | ICD-10-CM | POA: Diagnosis not present

## 2022-11-22 DIAGNOSIS — M9905 Segmental and somatic dysfunction of pelvic region: Secondary | ICD-10-CM | POA: Diagnosis not present

## 2022-11-22 DIAGNOSIS — M9903 Segmental and somatic dysfunction of lumbar region: Secondary | ICD-10-CM | POA: Diagnosis not present

## 2022-11-22 DIAGNOSIS — M9904 Segmental and somatic dysfunction of sacral region: Secondary | ICD-10-CM | POA: Diagnosis not present

## 2022-11-22 DIAGNOSIS — M5136 Other intervertebral disc degeneration, lumbar region: Secondary | ICD-10-CM | POA: Diagnosis not present

## 2022-11-24 DIAGNOSIS — M9903 Segmental and somatic dysfunction of lumbar region: Secondary | ICD-10-CM | POA: Diagnosis not present

## 2022-11-24 DIAGNOSIS — M9905 Segmental and somatic dysfunction of pelvic region: Secondary | ICD-10-CM | POA: Diagnosis not present

## 2022-11-24 DIAGNOSIS — M9904 Segmental and somatic dysfunction of sacral region: Secondary | ICD-10-CM | POA: Diagnosis not present

## 2022-11-24 DIAGNOSIS — M5136 Other intervertebral disc degeneration, lumbar region: Secondary | ICD-10-CM | POA: Diagnosis not present

## 2022-12-01 DIAGNOSIS — M9905 Segmental and somatic dysfunction of pelvic region: Secondary | ICD-10-CM | POA: Diagnosis not present

## 2022-12-01 DIAGNOSIS — M9904 Segmental and somatic dysfunction of sacral region: Secondary | ICD-10-CM | POA: Diagnosis not present

## 2022-12-01 DIAGNOSIS — M9903 Segmental and somatic dysfunction of lumbar region: Secondary | ICD-10-CM | POA: Diagnosis not present

## 2022-12-01 DIAGNOSIS — M5136 Other intervertebral disc degeneration, lumbar region: Secondary | ICD-10-CM | POA: Diagnosis not present

## 2022-12-06 DIAGNOSIS — M9903 Segmental and somatic dysfunction of lumbar region: Secondary | ICD-10-CM | POA: Diagnosis not present

## 2022-12-06 DIAGNOSIS — M9905 Segmental and somatic dysfunction of pelvic region: Secondary | ICD-10-CM | POA: Diagnosis not present

## 2022-12-06 DIAGNOSIS — M9904 Segmental and somatic dysfunction of sacral region: Secondary | ICD-10-CM | POA: Diagnosis not present

## 2022-12-06 DIAGNOSIS — M5136 Other intervertebral disc degeneration, lumbar region: Secondary | ICD-10-CM | POA: Diagnosis not present

## 2022-12-13 DIAGNOSIS — M5136 Other intervertebral disc degeneration, lumbar region: Secondary | ICD-10-CM | POA: Diagnosis not present

## 2022-12-13 DIAGNOSIS — M9903 Segmental and somatic dysfunction of lumbar region: Secondary | ICD-10-CM | POA: Diagnosis not present

## 2022-12-13 DIAGNOSIS — M9905 Segmental and somatic dysfunction of pelvic region: Secondary | ICD-10-CM | POA: Diagnosis not present

## 2022-12-13 DIAGNOSIS — M9904 Segmental and somatic dysfunction of sacral region: Secondary | ICD-10-CM | POA: Diagnosis not present

## 2022-12-15 DIAGNOSIS — M9903 Segmental and somatic dysfunction of lumbar region: Secondary | ICD-10-CM | POA: Diagnosis not present

## 2022-12-15 DIAGNOSIS — M9905 Segmental and somatic dysfunction of pelvic region: Secondary | ICD-10-CM | POA: Diagnosis not present

## 2022-12-15 DIAGNOSIS — M9904 Segmental and somatic dysfunction of sacral region: Secondary | ICD-10-CM | POA: Diagnosis not present

## 2022-12-15 DIAGNOSIS — M5136 Other intervertebral disc degeneration, lumbar region: Secondary | ICD-10-CM | POA: Diagnosis not present

## 2022-12-23 DIAGNOSIS — I10 Essential (primary) hypertension: Secondary | ICD-10-CM | POA: Diagnosis not present

## 2022-12-23 DIAGNOSIS — E46 Unspecified protein-calorie malnutrition: Secondary | ICD-10-CM | POA: Diagnosis not present

## 2022-12-23 DIAGNOSIS — J439 Emphysema, unspecified: Secondary | ICD-10-CM | POA: Diagnosis not present

## 2022-12-23 DIAGNOSIS — E785 Hyperlipidemia, unspecified: Secondary | ICD-10-CM | POA: Diagnosis not present

## 2023-04-20 DIAGNOSIS — I7143 Infrarenal abdominal aortic aneurysm, without rupture: Secondary | ICD-10-CM | POA: Diagnosis not present

## 2023-04-20 DIAGNOSIS — I10 Essential (primary) hypertension: Secondary | ICD-10-CM | POA: Diagnosis not present

## 2023-04-20 DIAGNOSIS — I6523 Occlusion and stenosis of bilateral carotid arteries: Secondary | ICD-10-CM | POA: Diagnosis not present

## 2023-04-20 DIAGNOSIS — E785 Hyperlipidemia, unspecified: Secondary | ICD-10-CM | POA: Diagnosis not present

## 2023-04-20 DIAGNOSIS — I700 Atherosclerosis of aorta: Secondary | ICD-10-CM | POA: Diagnosis not present

## 2023-04-20 DIAGNOSIS — I739 Peripheral vascular disease, unspecified: Secondary | ICD-10-CM | POA: Diagnosis not present

## 2023-04-20 DIAGNOSIS — N1831 Chronic kidney disease, stage 3a: Secondary | ICD-10-CM | POA: Diagnosis not present

## 2023-04-22 ENCOUNTER — Other Ambulatory Visit: Payer: Self-pay | Admitting: *Deleted

## 2023-04-22 DIAGNOSIS — I739 Peripheral vascular disease, unspecified: Secondary | ICD-10-CM

## 2023-04-26 DIAGNOSIS — L308 Other specified dermatitis: Secondary | ICD-10-CM | POA: Diagnosis not present

## 2023-04-26 NOTE — Progress Notes (Unsigned)
HISTORY AND PHYSICAL     CC:  follow up. Requesting Provider:  Merri Brunette, MD  HPI: This is a 89 y.o. male who is here today for follow up for PAD. In April 2023, he had CT scan of abdomen for abdominal pain and found to have a 3.8cm AAA.    Pt was seen in November 2023 and at that time, he was not having any claudication, rest pain or non healing wounds but did have sudden onset of RLE weakness that lasted about 25 minutes and experienced this again that also involved both arms.  He was brought back for carotid duplex, which revealed 1-39% bilateral ICA stenosis.  He also had AAA duplex that revealed 3.5cm AAA.    The pt returns today for follow up.  He states that he has terrible pain in his legs with the right worse than left.  He states it happens with walking and sometimes with sitting.  He states that he continues to play golf and he can walk about 40-50 years and then have to stop.  He states that he does not have any pain in his feet.  He does not have non healing wounds.  He does go barefooted around the house. He states he saw on TV that asa was bad for older folks and his PCP told him he could stop this.  He was not having any issues with the baby asa.    He has been married 64 years.  The pt is on a statin for cholesterol management.    The pt is on an aspirin.    Other AC:  none The pt is not on medication for hypertension.  The pt is not on medication for diabetes. Tobacco hx:  current   Past Medical History:  Diagnosis Date   AAA (abdominal aortic aneurysm) (HCC)    Aortic aneurysm (HCC)    GERD (gastroesophageal reflux disease)    Hypercholesteremia     Past Surgical History:  Procedure Laterality Date   HERNIA REPAIR     ORTHOPEDIC SURGERY Left    wrist    Allergies  Allergen Reactions   Codeine     Hallucinations    Current Outpatient Medications  Medication Sig Dispense Refill   aspirin EC 81 MG tablet Take 81 mg by mouth daily. Swallow whole.      pantoprazole (PROTONIX) 40 MG tablet Take 40 mg by mouth daily.     simvastatin (ZOCOR) 20 MG tablet Take 20 mg by mouth every evening.     No current facility-administered medications for this visit.    Family History  Problem Relation Age of Onset   Heart disease Mother    Heart attack Sister     Social History   Socioeconomic History   Marital status: Married    Spouse name: Not on file   Number of children: 2   Years of education: Not on file   Highest education level: Not on file  Occupational History   Not on file  Tobacco Use   Smoking status: Every Day    Current packs/day: 0.50    Types: Cigarettes    Passive exposure: Never   Smokeless tobacco: Current  Vaping Use   Vaping status: Never Used  Substance and Sexual Activity   Alcohol use: Yes   Drug use: No   Sexual activity: Not on file  Other Topics Concern   Not on file  Social History Narrative   Not on file  Social Determinants of Health   Financial Resource Strain: Not on file  Food Insecurity: Not on file  Transportation Needs: Not on file  Physical Activity: Not on file  Stress: Not on file  Social Connections: Not on file  Intimate Partner Violence: Not on file     REVIEW OF SYSTEMS:   [X]  denotes positive finding, [ ]  denotes negative finding Cardiac  Comments:  Chest pain or chest pressure:    Shortness of breath upon exertion:    Short of breath when lying flat:    Irregular heart rhythm:        Vascular    Pain in calf, thigh, or hip brought on by ambulation: x   Pain in feet at night that wakes you up from your sleep:     Blood clot in your veins:    Leg swelling:         Pulmonary    Oxygen at home:    Productive cough:     Wheezing:         Neurologic    Sudden weakness in arms or legs:     Sudden numbness in arms or legs:     Sudden onset of difficulty speaking or slurred speech:    Temporary loss of vision in one eye:     Problems with dizziness:          Gastrointestinal    Blood in stool:     Vomited blood:         Genitourinary    Burning when urinating:     Blood in urine:        Psychiatric    Major depression:         Hematologic    Bleeding problems:    Problems with blood clotting too easily:        Skin    Rashes or ulcers:        Constitutional    Fever or chills:      PHYSICAL EXAMINATION:  Today's Vitals   04/27/23 1001 04/27/23 1003  BP: (!) 143/61 (!) 125/58  Pulse: 62   Temp: 97.9 F (36.6 C)   TempSrc: Temporal   SpO2: 92%   Weight: 137 lb 11.2 oz (62.5 kg)   Height: 5\' 9"  (1.753 m)    Body mass index is 20.33 kg/m.   General:  WDWN in NAD; vital signs documented above Gait: Not observed HENT: WNL, normocephalic Pulmonary: normal non-labored breathing , without wheezing Cardiac: regular HR, without carotid bruits Abdomen: soft, NT; aortic pulse is  palpable Skin: without rashes Vascular Exam/Pulses:  Right Left  Radial 2+ (normal) 2+ (normal)  Femoral 2+ (normal) 2+ (normal)  Popliteal Unable to palpate Unable to palpate  DP monophasic Palpable and biphasic  PT monophasic biphasic  Peroneal monophasic biphasic   Extremities: without ischemic changes, without Gangrene , without cellulitis; without open wounds Musculoskeletal: no muscle wasting or atrophy  Neurologic: A&O X 3 Psychiatric:  The pt has Normal affect.   Non-Invasive Vascular Imaging:   ABI's/TBI's on 04/27/2023: Right:  0.61/0.22 - Great toe pressure: 31 Left:  1.16/0.58 - Great toe pressure: 81  Previous ABI's/TBI's on 03/22/2022: Right:  0.82/0.25 - Great toe pressure: 41 Left:  0.97/0.38 - Great toe pressure:  62  Previous AAA duplex on 03/22/2022: Abdominal Aorta Findings:  +-----------+-------+----------+----------+--------+--------+--------+  Location  AP (cm)Trans (cm)PSV (cm/s)WaveformThrombusComments  +-----------+-------+----------+----------+--------+--------+--------+  Proximal  2.50   2.40       49                                  +-----------+-------+----------+----------+--------+--------+--------+  Mid       1.90   2.00      77                                  +-----------+-------+----------+----------+--------+--------+--------+  Distal    3.50   3.40      42                Present           +-----------+-------+----------+----------+--------+--------+--------+  RT CIA Prox1.3    1.3       105                                 +-----------+-------+----------+----------+--------+--------+--------+  LT CIA Prox1.2    1.1       96                                  +-----------+-------+----------+----------+--------+--------+--------+   Previous carotid duplex 04/14/2022: 1-39% Bilateral ICA stenosis.    ASSESSMENT/PLAN:: 89 y.o. male here for follow up for PAD with hx of PAD.  In April 2023, he had CT scan of abdomen for abdominal pain and found to have a 3.8cm AAA.    PAD: -pt with claudication sx at 40 yards without non healing wounds or rest pain and this is worse in the right leg.  He also gets some pain in his legs with sitting.   -discussed with pt that his ABI has dropped on the right.  Discussed starting with conservative measures such as smoking cessation and graduated walking program.  I discussed with him that he should protect his feet and not go barefooted bc if he develops a wound on his feet, this could cause more issues.  Discussed if this progresses, we may consider angiogram but would prefer to continue with conservative measures if possible -I have asked him to restart his baby asa since he had stopped this.  He was not having any issues with the asa.   -pt will f/u in 6 months with ABI and see Dr. Randie Heinz to see if his sx are improving and possible consideration for angiogram. -he will call sooner if he has any issues before then.  AAA -last measured 3.5cm one year ago.  He will have f/u duplex in one year  Current  smoker -discussed importance of smoking cessation and that it could put him at risk for limb loss, carotid disease and heart disease.       Doreatha Massed, [facility] [phone]  Clinic MD:   Randie Heinz

## 2023-04-27 ENCOUNTER — Ambulatory Visit (HOSPITAL_COMMUNITY)
Admission: RE | Admit: 2023-04-27 | Discharge: 2023-04-27 | Disposition: A | Discharge status: Home / Self Care | Payer: Medicare Other | Source: Ambulatory Visit | Attending: Vascular Surgery | Admitting: Vascular Surgery

## 2023-04-27 ENCOUNTER — Ambulatory Visit: Payer: Medicare Other | Admitting: Physician Assistant

## 2023-04-27 VITALS — BP 125/58 | HR 62 | Temp 97.9°F | Ht 69.0 in | Wt 137.7 lb

## 2023-04-27 DIAGNOSIS — I739 Peripheral vascular disease, unspecified: Secondary | ICD-10-CM | POA: Diagnosis not present

## 2023-04-27 DIAGNOSIS — F17200 Nicotine dependence, unspecified, uncomplicated: Secondary | ICD-10-CM

## 2023-04-27 LAB — VAS US ABI WITH/WO TBI
Left ABI: 1.16
Right ABI: 0.61

## 2023-04-28 ENCOUNTER — Other Ambulatory Visit: Payer: Self-pay

## 2023-04-28 DIAGNOSIS — I739 Peripheral vascular disease, unspecified: Secondary | ICD-10-CM

## 2023-08-02 DIAGNOSIS — M25562 Pain in left knee: Secondary | ICD-10-CM | POA: Diagnosis not present

## 2023-08-02 DIAGNOSIS — M25561 Pain in right knee: Secondary | ICD-10-CM | POA: Diagnosis not present

## 2023-08-09 ENCOUNTER — Telehealth: Payer: Self-pay

## 2023-08-09 ENCOUNTER — Other Ambulatory Visit: Payer: Self-pay

## 2023-08-09 DIAGNOSIS — I739 Peripheral vascular disease, unspecified: Secondary | ICD-10-CM

## 2023-08-09 NOTE — Telephone Encounter (Signed)
 Appt:  -pt LM stating his R leg has been a lot worse about a month ago and has had a few falls d/t his leg giving out.  He wanted to get an earlier appt before June.  Appts changed to April and duplex added to studies.

## 2023-08-22 DIAGNOSIS — R058 Other specified cough: Secondary | ICD-10-CM | POA: Diagnosis not present

## 2023-08-25 DIAGNOSIS — R509 Fever, unspecified: Secondary | ICD-10-CM | POA: Diagnosis not present

## 2023-08-25 DIAGNOSIS — R058 Other specified cough: Secondary | ICD-10-CM | POA: Diagnosis not present

## 2023-08-25 DIAGNOSIS — J439 Emphysema, unspecified: Secondary | ICD-10-CM | POA: Diagnosis not present

## 2023-09-06 DIAGNOSIS — R490 Dysphonia: Secondary | ICD-10-CM | POA: Diagnosis not present

## 2023-09-07 ENCOUNTER — Ambulatory Visit (HOSPITAL_COMMUNITY)
Admission: RE | Admit: 2023-09-07 | Discharge: 2023-09-07 | Disposition: A | Discharge status: Home / Self Care | Source: Ambulatory Visit | Attending: Vascular Surgery | Admitting: Vascular Surgery

## 2023-09-07 ENCOUNTER — Ambulatory Visit (HOSPITAL_BASED_OUTPATIENT_CLINIC_OR_DEPARTMENT_OTHER)
Admission: RE | Admit: 2023-09-07 | Discharge: 2023-09-07 | Disposition: A | Discharge status: Home / Self Care | Source: Ambulatory Visit | Attending: Vascular Surgery | Admitting: Vascular Surgery

## 2023-09-07 DIAGNOSIS — I70201 Unspecified atherosclerosis of native arteries of extremities, right leg: Secondary | ICD-10-CM

## 2023-09-07 DIAGNOSIS — I739 Peripheral vascular disease, unspecified: Secondary | ICD-10-CM | POA: Diagnosis not present

## 2023-09-07 LAB — VAS US ABI WITH/WO TBI
Left ABI: 1.02
Right ABI: 0.58

## 2023-09-14 ENCOUNTER — Ambulatory Visit: Attending: Vascular Surgery | Admitting: Vascular Surgery

## 2023-09-14 ENCOUNTER — Encounter: Payer: Self-pay | Admitting: Vascular Surgery

## 2023-09-14 VITALS — BP 156/72 | HR 60 | Temp 98.1°F | Ht 69.0 in | Wt 137.0 lb

## 2023-09-14 DIAGNOSIS — I714 Abdominal aortic aneurysm, without rupture, unspecified: Secondary | ICD-10-CM

## 2023-09-14 DIAGNOSIS — I70211 Atherosclerosis of native arteries of extremities with intermittent claudication, right leg: Secondary | ICD-10-CM

## 2023-09-14 NOTE — Progress Notes (Signed)
 Patient ID: [name], male   DOB: [birth year], 89 y.o.   MRN: [id number]  Reason for Consult: No chief complaint on file.   Referred by Faustina Hood, MD  Subjective:     HPI:  [name] is a 89 y.o. male with history of small and also has history of claudication in his bilateral lower extremities.  He states that the right is greater than the left and gives him pain below his knee even with taking the trash to the end of the driveway.  He states that he does not do much walking due to the pain.  He denies any tissue loss or ulceration.  He continues to smoke approximately half pack per day.  He does take aspirin and a statin.  He has additional risk factors of hypercholesterolemia and hypertension.  Past Medical History:  Diagnosis Date   AAA (abdominal aortic aneurysm) (HCC)    Aortic aneurysm (HCC)    Carotid artery occlusion    GERD (gastroesophageal reflux disease)    Hypercholesteremia    Hypertension    Family History  Problem Relation Age of Onset   Heart disease Mother    Heart attack Sister    Past Surgical History:  Procedure Laterality Date   HERNIA REPAIR     ORTHOPEDIC SURGERY Left    wrist    Short Social History:  Social History   Tobacco Use   Smoking status: Every Day    Current packs/day: 0.50    Types: Cigarettes    Passive exposure: Never   Smokeless tobacco: Current  Substance Use Topics   Alcohol use: Yes    Allergies  Allergen Reactions   Codeine     Hallucinations    Current Outpatient Medications  Medication Sig Dispense Refill   aspirin EC 81 MG tablet Take 81 mg by mouth daily. Swallow whole.     losartan (COZAAR) 25 MG tablet Take 25 mg by mouth daily.     pantoprazole (PROTONIX) 40 MG tablet Take 40 mg by mouth daily.     simvastatin (ZOCOR) 20 MG tablet Take 20 mg by mouth every evening.     No current facility-administered medications for this visit.    Review of Systems  Constitutional:  Constitutional  negative. HENT: HENT negative.  Eyes: Eyes negative.  Respiratory: Respiratory negative.  Cardiovascular: Positive for claudication.  GI: Gastrointestinal negative.  Musculoskeletal: Musculoskeletal negative. Positive for leg pain.  Skin: Skin negative.  Neurological: Neurological negative. Hematologic: Hematologic/lymphatic negative.  Psychiatric: Psychiatric negative.        Objective:  Objective   Vitals:   09/14/23 0841  BP: (!) 156/72  Pulse: 60  Temp: 98.1 F (36.7 C)  SpO2: 99%      Physical Exam HENT:     Head: Normocephalic.     Nose: Nose normal.  Eyes:     Pupils: Pupils are equal, round, and reactive to light.  Cardiovascular:     Rate and Rhythm: Normal rate.     Pulses:          Femoral pulses are 1+ on the right side and 2+ on the left side.      Popliteal pulses are 0 on the right side and 2+ on the left side.       Dorsalis pedis pulses are 0 on the right side.       Posterior tibial pulses are 0 on the right side and 2+ on the left side.  Pulmonary:  Effort: Pulmonary effort is normal.  Abdominal:     General: Abdomen is flat.  Musculoskeletal:        General: Normal range of motion.     Cervical back: Normal range of motion and neck supple.     Right lower leg: No edema.     Left lower leg: No edema.  Skin:    General: Skin is warm.     Capillary Refill: Capillary refill takes 2 to 3 seconds.  Neurological:     General: No focal deficit present.     Mental Status: He is alert.  Psychiatric:        Mood and Affect: Mood normal.        Thought Content: Thought content normal.        Judgment: Judgment normal.     Data: +-----------+--------+-----+---------------+----------+--------+  RIGHT     PSV cm/sRatioStenosis       Waveform  Comments  +-----------+--------+-----+---------------+----------+--------+  CFA Distal 430          75-99% stenosisbiphasic             +-----------+--------+-----+---------------+----------+--------+  DFA       33                          monophasic          +-----------+--------+-----+---------------+----------+--------+  SFA Prox   408          75-99% stenosismonophasic          +-----------+--------+-----+---------------+----------+--------+  SFA Mid    53                          biphasic            +-----------+--------+-----+---------------+----------+--------+  SFA Distal 50                          monophasic          +-----------+--------+-----+---------------+----------+--------+  POP Prox   31                          monophasic          +-----------+--------+-----+---------------+----------+--------+  POP Distal              occluded                           +-----------+--------+-----+---------------+----------+--------+  ATA Distal              occluded                           +-----------+--------+-----+---------------+----------+--------+  PTA Distal 17                          monophasic          +-----------+--------+-----+---------------+----------+--------+  PERO Distal18                          monophasic          +-----------+--------+-----+---------------+----------+--------+   Summary:  Right: 75-99% stenosis noted in the common femoral artery. 75-99% stenosis  noted in the superficial femoral artery. Total occlusion noted in the  popliteal artery. Total occlusion noted in the anterior tibial artery.   ABI Findings:  +--------+------------------+-----+----------+--------+  Right  Rt Pressure (mmHg)IndexWaveform  Comment   +--------+------------------+-----+----------+--------+  [id number]                                       +--------+------------------+-----+----------+--------+  PTA    86                0.58 monophasic          +--------+------------------+-----+----------+--------+  DP     73                 0.49 monophasic          +--------+------------------+-----+----------+--------+   +---------+------------------+-----+---------+-------+  Left    Lt Pressure (mmHg)IndexWaveform Comment  +---------+------------------+-----+---------+-------+  Brachial 147                                      +---------+------------------+-----+---------+-------+  PTA     149               1.01 triphasic         +---------+------------------+-----+---------+-------+  DP      151               1.02 triphasic         +---------+------------------+-----+---------+-------+  Great Toe113               0.76                   +---------+------------------+-----+---------+-------+   +-------+-----------+-----------+------------+------------+  ABI/TBIToday's ABIToday's TBIPrevious ABIPrevious TBI  +-------+-----------+-----------+------------+------------+  Right 0.58       0.0        0.61        0.22          +-------+-----------+-----------+------------+------------+  Left  1.02       0.76       1.16        0.58          +-------+-----------+-----------+------------+------------+      Arterial wall calcification precludes accurate ankle pressures and ABIs.    Summary:  Right: Resting right ankle-brachial index indicates moderate right lower  extremity arterial disease. The right toe-brachial index is abnormal.   Left: Resting left ankle-brachial index is within normal range. The left  toe-brachial index is normal.      Assessment/Plan:     89 year old male with short distance life limiting claudication with mildly depressed right lower extremity ABIs with what appears to be multilevel disease including the common femoral, superficial femoral, popliteal and tibial vessels with what appears to be dominant runoff via the posterior tibial and peroneal arteries via most recent duplex.  I discussed the need for complete cessation of tobacco products  and continuing aspirin and statin therapies.  We discussed proceeding with angiography given the life-limiting nature of his disease process which would be from a left common femoral approach.  We discussed that he may not be a candidate for endovascular intervention and he demonstrates good understanding.  This will be scheduled on Monday in the future.  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 The patient is on best medical therapy for peripheral arterial disease. The patient has been counseled about the risks of tobacco use in atherosclerotic disease. The patient has been counseled to abstain from any tobacco use. An aortogram  with bilateral lower extremity runoff angiography and Right lower extremity intervention and is indicated to better evaluate the patient's lower extremity circulation because of the life limiting nature of the patient's diagnosis. Based on the patient's clinical exam and non-invasive data, we anticipate an endovascular intervention in the femoropopliteal and tibial vessels. Stenting and/or athrectomy would be favored because of the improved primary patency of these interventions as compared to plain balloon angioplasty.      Adine Hoof MD [facility]

## 2023-09-14 NOTE — H&P (View-Only) (Signed)
 Patient ID: [name], male   DOB: [birth year], 89 y.o.   MRN: [id number]  Reason for Consult: No chief complaint on file.   Referred by Faustina Hood, MD  Subjective:     HPI:  [name] is a 89 y.o. male with history of small and also has history of claudication in his bilateral lower extremities.  He states that the right is greater than the left and gives him pain below his knee even with taking the trash to the end of the driveway.  He states that he does not do much walking due to the pain.  He denies any tissue loss or ulceration.  He continues to smoke approximately half pack per day.  He does take aspirin and a statin.  He has additional risk factors of hypercholesterolemia and hypertension.  Past Medical History:  Diagnosis Date   AAA (abdominal aortic aneurysm) (HCC)    Aortic aneurysm (HCC)    Carotid artery occlusion    GERD (gastroesophageal reflux disease)    Hypercholesteremia    Hypertension    Family History  Problem Relation Age of Onset   Heart disease Mother    Heart attack Sister    Past Surgical History:  Procedure Laterality Date   HERNIA REPAIR     ORTHOPEDIC SURGERY Left    wrist    Short Social History:  Social History   Tobacco Use   Smoking status: Every Day    Current packs/day: 0.50    Types: Cigarettes    Passive exposure: Never   Smokeless tobacco: Current  Substance Use Topics   Alcohol use: Yes    Allergies  Allergen Reactions   Codeine     Hallucinations    Current Outpatient Medications  Medication Sig Dispense Refill   aspirin EC 81 MG tablet Take 81 mg by mouth daily. Swallow whole.     losartan (COZAAR) 25 MG tablet Take 25 mg by mouth daily.     pantoprazole (PROTONIX) 40 MG tablet Take 40 mg by mouth daily.     simvastatin (ZOCOR) 20 MG tablet Take 20 mg by mouth every evening.     No current facility-administered medications for this visit.    Review of Systems  Constitutional:  Constitutional  negative. HENT: HENT negative.  Eyes: Eyes negative.  Respiratory: Respiratory negative.  Cardiovascular: Positive for claudication.  GI: Gastrointestinal negative.  Musculoskeletal: Musculoskeletal negative. Positive for leg pain.  Skin: Skin negative.  Neurological: Neurological negative. Hematologic: Hematologic/lymphatic negative.  Psychiatric: Psychiatric negative.        Objective:  Objective   Vitals:   09/14/23 0841  BP: (!) 156/72  Pulse: 60  Temp: 98.1 F (36.7 C)  SpO2: 99%      Physical Exam HENT:     Head: Normocephalic.     Nose: Nose normal.  Eyes:     Pupils: Pupils are equal, round, and reactive to light.  Cardiovascular:     Rate and Rhythm: Normal rate.     Pulses:          Femoral pulses are 1+ on the right side and 2+ on the left side.      Popliteal pulses are 0 on the right side and 2+ on the left side.       Dorsalis pedis pulses are 0 on the right side.       Posterior tibial pulses are 0 on the right side and 2+ on the left side.  Pulmonary:  Effort: Pulmonary effort is normal.  Abdominal:     General: Abdomen is flat.  Musculoskeletal:        General: Normal range of motion.     Cervical back: Normal range of motion and neck supple.     Right lower leg: No edema.     Left lower leg: No edema.  Skin:    General: Skin is warm.     Capillary Refill: Capillary refill takes 2 to 3 seconds.  Neurological:     General: No focal deficit present.     Mental Status: He is alert.  Psychiatric:        Mood and Affect: Mood normal.        Thought Content: Thought content normal.        Judgment: Judgment normal.     Data: +-----------+--------+-----+---------------+----------+--------+  RIGHT     PSV cm/sRatioStenosis       Waveform  Comments  +-----------+--------+-----+---------------+----------+--------+  CFA Distal 430          75-99% stenosisbiphasic             +-----------+--------+-----+---------------+----------+--------+  DFA       33                          monophasic          +-----------+--------+-----+---------------+----------+--------+  SFA Prox   408          75-99% stenosismonophasic          +-----------+--------+-----+---------------+----------+--------+  SFA Mid    53                          biphasic            +-----------+--------+-----+---------------+----------+--------+  SFA Distal 50                          monophasic          +-----------+--------+-----+---------------+----------+--------+  POP Prox   31                          monophasic          +-----------+--------+-----+---------------+----------+--------+  POP Distal              occluded                           +-----------+--------+-----+---------------+----------+--------+  ATA Distal              occluded                           +-----------+--------+-----+---------------+----------+--------+  PTA Distal 17                          monophasic          +-----------+--------+-----+---------------+----------+--------+  PERO Distal18                          monophasic          +-----------+--------+-----+---------------+----------+--------+   Summary:  Right: 75-99% stenosis noted in the common femoral artery. 75-99% stenosis  noted in the superficial femoral artery. Total occlusion noted in the  popliteal artery. Total occlusion noted in the anterior tibial artery.   ABI Findings:  +--------+------------------+-----+----------+--------+  Right  Rt Pressure (mmHg)IndexWaveform  Comment   +--------+------------------+-----+----------+--------+  [id number]                                       +--------+------------------+-----+----------+--------+  PTA    86                0.58 monophasic          +--------+------------------+-----+----------+--------+  DP     73                 0.49 monophasic          +--------+------------------+-----+----------+--------+   +---------+------------------+-----+---------+-------+  Left    Lt Pressure (mmHg)IndexWaveform Comment  +---------+------------------+-----+---------+-------+  Brachial 147                                      +---------+------------------+-----+---------+-------+  PTA     149               1.01 triphasic         +---------+------------------+-----+---------+-------+  DP      151               1.02 triphasic         +---------+------------------+-----+---------+-------+  Great Toe113               0.76                   +---------+------------------+-----+---------+-------+   +-------+-----------+-----------+------------+------------+  ABI/TBIToday's ABIToday's TBIPrevious ABIPrevious TBI  +-------+-----------+-----------+------------+------------+  Right 0.58       0.0        0.61        0.22          +-------+-----------+-----------+------------+------------+  Left  1.02       0.76       1.16        0.58          +-------+-----------+-----------+------------+------------+      Arterial wall calcification precludes accurate ankle pressures and ABIs.    Summary:  Right: Resting right ankle-brachial index indicates moderate right lower  extremity arterial disease. The right toe-brachial index is abnormal.   Left: Resting left ankle-brachial index is within normal range. The left  toe-brachial index is normal.      Assessment/Plan:     89 year old male with short distance life limiting claudication with mildly depressed right lower extremity ABIs with what appears to be multilevel disease including the common femoral, superficial femoral, popliteal and tibial vessels with what appears to be dominant runoff via the posterior tibial and peroneal arteries via most recent duplex.  I discussed the need for complete cessation of tobacco products  and continuing aspirin and statin therapies.  We discussed proceeding with angiography given the life-limiting nature of his disease process which would be from a left common femoral approach.  We discussed that he may not be a candidate for endovascular intervention and he demonstrates good understanding.  This will be scheduled on Monday in the future.  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 The patient is on best medical therapy for peripheral arterial disease. The patient has been counseled about the risks of tobacco use in atherosclerotic disease. The patient has been counseled to abstain from any tobacco use. An aortogram  with bilateral lower extremity runoff angiography and Right lower extremity intervention and is indicated to better evaluate the patient's lower extremity circulation because of the life limiting nature of the patient's diagnosis. Based on the patient's clinical exam and non-invasive data, we anticipate an endovascular intervention in the femoropopliteal and tibial vessels. Stenting and/or athrectomy would be favored because of the improved primary patency of these interventions as compared to plain balloon angioplasty.      Adine Hoof MD [facility]

## 2023-09-15 ENCOUNTER — Other Ambulatory Visit: Payer: Self-pay

## 2023-09-15 DIAGNOSIS — I70211 Atherosclerosis of native arteries of extremities with intermittent claudication, right leg: Secondary | ICD-10-CM

## 2023-09-26 ENCOUNTER — Other Ambulatory Visit: Payer: Self-pay

## 2023-09-26 ENCOUNTER — Encounter (HOSPITAL_COMMUNITY): Payer: Self-pay | Admitting: Vascular Surgery

## 2023-09-26 ENCOUNTER — Encounter (HOSPITAL_COMMUNITY)
Admission: RE | Disposition: A | Discharge status: Home / Self Care | Payer: Self-pay | Source: Home / Self Care | Attending: Vascular Surgery

## 2023-09-26 ENCOUNTER — Ambulatory Visit (HOSPITAL_COMMUNITY)
Admission: RE | Admit: 2023-09-26 | Discharge: 2023-09-26 | Disposition: A | Discharge status: Home / Self Care | Attending: Vascular Surgery | Admitting: Vascular Surgery

## 2023-09-26 DIAGNOSIS — I10 Essential (primary) hypertension: Secondary | ICD-10-CM | POA: Insufficient documentation

## 2023-09-26 DIAGNOSIS — F17210 Nicotine dependence, cigarettes, uncomplicated: Secondary | ICD-10-CM | POA: Diagnosis not present

## 2023-09-26 DIAGNOSIS — I70211 Atherosclerosis of native arteries of extremities with intermittent claudication, right leg: Secondary | ICD-10-CM | POA: Diagnosis not present

## 2023-09-26 DIAGNOSIS — Z7982 Long term (current) use of aspirin: Secondary | ICD-10-CM | POA: Insufficient documentation

## 2023-09-26 DIAGNOSIS — Z79899 Other long term (current) drug therapy: Secondary | ICD-10-CM | POA: Diagnosis not present

## 2023-09-26 DIAGNOSIS — E7800 Pure hypercholesterolemia, unspecified: Secondary | ICD-10-CM | POA: Diagnosis not present

## 2023-09-26 HISTORY — PX: ABDOMINAL AORTOGRAM: CATH118222

## 2023-09-26 HISTORY — PX: LOWER EXTREMITY ANGIOGRAPHY: CATH118251

## 2023-09-26 HISTORY — PX: LOWER EXTREMITY INTERVENTION: CATH118252

## 2023-09-26 LAB — POCT I-STAT, CHEM 8
BUN: 22 mg/dL (ref 8–23)
Calcium, Ion: 1.16 mmol/L (ref 1.15–1.40)
Chloride: 106 mmol/L (ref 98–111)
Creatinine, Ser: 1.3 mg/dL — ABNORMAL HIGH (ref 0.61–1.24)
Glucose, Bld: 77 mg/dL (ref 70–99)
HCT: 37 % — ABNORMAL LOW (ref 39.0–52.0)
Hemoglobin: 12.6 g/dL — ABNORMAL LOW (ref 13.0–17.0)
Potassium: 3.8 mmol/L (ref 3.5–5.1)
Sodium: 141 mmol/L (ref 135–145)
TCO2: 25 mmol/L (ref 22–32)

## 2023-09-26 SURGERY — ABDOMINAL AORTOGRAM
Anesthesia: LOCAL

## 2023-09-26 MED ORDER — LIDOCAINE HCL (PF) 1 % IJ SOLN
INTRAMUSCULAR | Status: AC
  Filled 2023-09-26: qty 30

## 2023-09-26 MED ORDER — SODIUM CHLORIDE 0.9 % IV SOLN: 250.0000 mL | INTRAVENOUS | Status: DC | PRN

## 2023-09-26 MED ORDER — MIDAZOLAM HCL 2 MG/2ML IJ SOLN
INTRAMUSCULAR | Status: DC | PRN
  Administered 2023-09-26: 1 mg via INTRAVENOUS

## 2023-09-26 MED ORDER — HEPARIN SODIUM (PORCINE) 1000 UNIT/ML IJ SOLN
INTRAMUSCULAR | Status: AC
  Filled 2023-09-26: qty 10

## 2023-09-26 MED ORDER — ACETAMINOPHEN 325 MG PO TABS: 650.0000 mg | ORAL_TABLET | ORAL | Status: DC | PRN

## 2023-09-26 MED ORDER — MIDAZOLAM HCL 2 MG/2ML IJ SOLN
INTRAMUSCULAR | Status: AC
  Filled 2023-09-26: qty 2

## 2023-09-26 MED ORDER — HEPARIN (PORCINE) IN NACL 2000-0.9 UNIT/L-% IV SOLN
INTRAVENOUS | Status: DC | PRN
  Administered 2023-09-26: 1000 mL

## 2023-09-26 MED ORDER — ONDANSETRON HCL 4 MG/2ML IJ SOLN: 4.0000 mg | Freq: Four times a day (QID) | INTRAMUSCULAR | Status: DC | PRN

## 2023-09-26 MED ORDER — LIDOCAINE HCL (PF) 1 % IJ SOLN
INTRAMUSCULAR | Status: DC | PRN
  Administered 2023-09-26: 15 mL

## 2023-09-26 MED ORDER — SODIUM CHLORIDE 0.9% FLUSH: 3.0000 mL | Freq: Two times a day (BID) | INTRAVENOUS | Status: DC

## 2023-09-26 MED ORDER — SODIUM CHLORIDE 0.9% FLUSH: 3.0000 mL | INTRAVENOUS | Status: DC | PRN

## 2023-09-26 MED ORDER — IODIXANOL 320 MG/ML IV SOLN
INTRAVENOUS | Status: DC | PRN
  Administered 2023-09-26: 70 mL

## 2023-09-26 MED ORDER — SODIUM CHLORIDE 0.9 % IV SOLN: INTRAVENOUS | Status: DC

## 2023-09-26 MED ORDER — FENTANYL CITRATE (PF) 100 MCG/2ML IJ SOLN
INTRAMUSCULAR | Status: AC
  Filled 2023-09-26: qty 2

## 2023-09-26 MED ORDER — SODIUM CHLORIDE 0.9 % WEIGHT BASED INFUSION: 1.0000 mL/kg/h | INTRAVENOUS | Status: DC

## 2023-09-26 MED ORDER — LABETALOL HCL 5 MG/ML IV SOLN: 10.0000 mg | INTRAVENOUS | Status: DC | PRN

## 2023-09-26 MED ORDER — FENTANYL CITRATE (PF) 100 MCG/2ML IJ SOLN
INTRAMUSCULAR | Status: DC | PRN
  Administered 2023-09-26: 50 ug via INTRAVENOUS

## 2023-09-26 MED ORDER — HYDRALAZINE HCL 20 MG/ML IJ SOLN: 5.0000 mg | INTRAMUSCULAR | Status: DC | PRN

## 2023-09-26 SURGICAL SUPPLY — 9 items
CATH ANGIO 5F PIGTAIL 65CM (CATHETERS) IMPLANT
CATH OMNI FLUSH 5F 65CM (CATHETERS) IMPLANT
CLOSURE MYNX CONTROL 5F (Vascular Products) IMPLANT
GLIDEWIRE ADV .035X260CM (WIRE) IMPLANT
KIT MICROPUNCTURE NIT STIFF (SHEATH) IMPLANT
PACK CARDIAC CATHETERIZATION (CUSTOM PROCEDURE TRAY) IMPLANT
SET ATX-X65L (MISCELLANEOUS) IMPLANT
SHEATH PINNACLE 5F 10CM (SHEATH) IMPLANT
WIRE BENTSON .035X145CM (WIRE) IMPLANT

## 2023-09-26 NOTE — Interval H&P Note (Signed)
 History and Physical Interval Note:  09/26/2023 1:55 PM  Justin Farm.  has presented today for surgery, with the diagnosis of athrosclerosis of right lower extremity.  The various methods of treatment have been discussed with the patient and family. After consideration of risks, benefits and other options for treatment, the patient has consented to  Procedure(s): ABDOMINAL AORTOGRAM (N/A) Lower Extremity Angiography (N/A) LOWER EXTREMITY INTERVENTION (N/A) as a surgical intervention.  The patient's history has been reviewed, patient examined, no change in status, stable for surgery.  I have reviewed the patient's chart and labs.  Questions were answered to the patient's satisfaction.     Philipp Brawn

## 2023-09-26 NOTE — Progress Notes (Signed)
 Patient walked to the bathroom without difficulties. Left groin level 0, clean, dry, and intact.

## 2023-09-26 NOTE — Op Note (Signed)
    Patient name: [name]. MRN: [id number] DOB: 1933/07/12 Sex: male  09/26/2023 Pre-operative Diagnosis: PAD with short distance lifestyle limiting claudication Post-operative diagnosis:  Same Surgeon:  Philipp Brawn, MD Procedure Performed:  Ultrasound-guided access of left common femoral artery Aortogram and bilateral lower extremity angiogram Second-order cannulation of right external iliac Mynx closure of left common femoral artery 17 minutes moderate sedation with fentanyl and Versed   Indications: [name] is a 89 year old male with significant short distance life limiting claudication.  He was seen in clinic by Dr. Vikki Graves and noted to have significantly diminished noninvasive testing.  Risks and benefits of angiogram with possible intervention were reviewed, he expressed understanding and elects to proceed.  Findings:  Widely patent aorta and bilateral renal arteries.  Some ectasia in the infrarenal aorta. Patent iliac systems bilaterally.  Right common femoral artery with severe stenosis, greater than 80%.  Profunda and SFA are patent.  The SFA has multifocal disease causing less than 50% stenosis.  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  The PT and peroneal are reconstituted by collaterals.  The DP fills on the foot via peroneal collaterals.  Left common femoral artery, and profunda are widely patent.  The SFA has a significant 70% stenosis proximally but then is patent throughout the thigh with mild multifocal disease.  The popliteal artery is widely patent.  The AT shortly occludes after its takeoff.  The TP trunk is significantly diseased with approximate 60% stenosis.  There is two-vessel runoff via the PT and peroneal.   Procedure:  The patient was identified in the holding area and taken to the cath lab  The patient was then placed supine on the table and prepped and draped in the usual sterile fashion.  A time out was called.   Ultrasound was used to evaluate the left common femoral artery.  It was patent .  A digital ultrasound image was acquired.  A micropuncture needle was used to access the left common femoral artery under ultrasound guidance.  An 018 wire was advanced without resistance and a micropuncture sheath was placed.  The 018 wire was removed and a benson wire was placed.  The micropuncture sheath was exchanged for a 5 french sheath.  An omniflush catheter was advanced over the wire to the level of L-1.  An abdominal angiogram was obtained.  Next, using the omniflush catheter and a glide advantage wire, the aortic bifurcation was crossed and the catheter was placed into theright external iliac artery and right runoff was obtained. This demonstrated the above findings. left runoff was performed via retrograde sheath injections which demonstrated the above findings.  Contrast: 70cc  Sedation: 17 minutes  Impression: Multifocal disease with a severe stenosis of the right common femoral and a popliteal CTO on the right with reconstitution of the PT and peroneal.  The left leg has inline flow to the foot via the PT and peroneal with a significant stenosis in the proximal SFA and TP trunk.   Philipp Brawn MD [facility] Office: [phone]

## 2023-09-26 NOTE — Discharge Instructions (Signed)
 Femoral Site Care This sheet gives you information about how to care for yourself after your procedure. Your health care provider may also give you more specific instructions. If you have problems or questions, contact your health care provider. What can I expect after the procedure?  After the procedure, it is common to have: Bruising that usually fades within 1-2 weeks. Tenderness at the site. Follow these instructions at home: Wound care Follow instructions from your health care provider about how to take care of your insertion site. Make sure you: Wash your hands with soap and water before you change your bandage (dressing). If soap and water are not available, use hand sanitizer. Remove your dressing as told by your health care provider. 24 hours Do not take baths, swim, or use a hot tub until your health care provider approves. You may shower 24-48 hours after the procedure or as told by your health care provider. Gently wash the site with plain soap and water. Pat the area dry with a clean towel. Do not rub the site. This may cause bleeding. Do not apply powder or lotion to the site. Keep the site clean and dry. Check your femoral site every day for signs of infection. Check for: Redness, swelling, or pain. Fluid or blood. Warmth. Pus or a bad smell. Activity For the first 2-3 days after your procedure, or as long as directed: Avoid climbing stairs as much as possible. Do not squat. Do not lift anything that is heavier than 10 lb (4.5 kg), or the limit that you are told, until your health care provider says that it is safe. For 5 days Rest as directed. Avoid sitting for a long time without moving. Get up to take short walks every 1-2 hours. Do not drive for 24 hours if you were given a medicine to help you relax (sedative). General instructions Take over-the-counter and prescription medicines only as told by your health care provider. Keep all follow-up visits as told by your  health care provider. This is important. Contact a health care provider if you have: A fever or chills. You have redness, swelling, or pain around your insertion site. Get help right away if: The catheter insertion area swells very fast. You pass out. You suddenly start to sweat or your skin gets clammy. The catheter insertion area is bleeding, and the bleeding does not stop when you hold steady pressure on the area. The area near or just beyond the catheter insertion site becomes pale, cool, tingly, or numb. These symptoms may represent a serious problem that is an emergency. Do not wait to see if the symptoms will go away. Get medical help right away. Call your local emergency services (911 in the [place] Do not drive yourself to the hospital. Summary After the procedure, it is common to have bruising that usually fades within 1-2 weeks. Check your femoral site every day for signs of infection. Do not lift anything that is heavier than 10 lb (4.5 kg), or the limit that you are told, until your health care provider says that it is safe. This information is not intended to replace advice given to you by your health care provider. Make sure you discuss any questions you have with your health care provider. Document Revised: 05/16/2017 Document Reviewed: 05/16/2017 Elsevier Patient Education  2020 [employer]

## 2023-09-27 ENCOUNTER — Other Ambulatory Visit: Payer: Self-pay

## 2023-09-27 DIAGNOSIS — I70211 Atherosclerosis of native arteries of extremities with intermittent claudication, right leg: Secondary | ICD-10-CM

## 2023-10-03 NOTE — Pre-Procedure Instructions (Signed)
 Surgical Instructions   Your procedure is scheduled on Thursday, May 22nd. Report to [facility] Main Entrance "A" at 07:15 A.M., then check in with the Admitting office. Any questions or running late day of surgery: call [phone]  Questions prior to your surgery date: call [phone], Monday-Friday, 8am-4pm. If you experience any cold or flu symptoms such as cough, fever, chills, shortness of breath, etc. between now and your scheduled surgery, please notify us  at the above number.     Remember:  Do not eat or drink after midnight the night before your surgery    Take these medicines the morning of surgery with A SIP OF WATER  ANORO ELLIPTA  aspirin  EC  simvastatin (ZOCOR)   May take these medicines IF NEEDED: acetaminophen  (TYLENOL )  pantoprazole (PROTONIX)   One week prior to surgery, STOP taking any Aleve, Naproxen, Ibuprofen , Motrin , Advil , Goody's, BC's, all herbal medications, fish oil, and non-prescription vitamins.                     Do NOT Smoke (Tobacco/Vaping) for 24 hours prior to your procedure.  If you use a CPAP at night, you may bring your mask/headgear for your overnight stay.   You will be asked to remove any contacts, glasses, piercing's, hearing aid's, dentures/partials prior to surgery. Please bring cases for these items if needed.    Patients discharged the day of surgery will not be allowed to drive home, and someone needs to stay with them for 24 hours.  SURGICAL WAITING ROOM VISITATION Patients may have no more than 2 support people in the waiting area - these visitors may rotate.   Pre-op nurse will coordinate an appropriate time for 1 ADULT support person, who may not rotate, to accompany patient in pre-op.  Children under the age of 15 must have an adult with them who is not the patient and must remain in the main waiting area with an adult.  If the patient needs to stay at the hospital during part of their recovery, the visitor guidelines  for inpatient rooms apply.  Please refer to the [facility] website for the visitor guidelines for any additional information.   If you received a COVID test during your pre-op visit  it is requested that you wear a mask when out in public, stay away from anyone that may not be feeling well and notify your surgeon if you develop symptoms. If you have been in contact with anyone that has tested positive in the last 10 days please notify you surgeon.      Pre-operative CHG Bathing Instructions   You can play a key role in reducing the risk of infection after surgery. Your skin needs to be as free of germs as possible. You can reduce the number of germs on your skin by washing with CHG (chlorhexidine gluconate) soap before surgery. CHG is an antiseptic soap that kills germs and continues to kill germs even after washing.   DO NOT use if you have an allergy to chlorhexidine/CHG or antibacterial soaps. If your skin becomes reddened or irritated, stop using the CHG and notify one of our RNs at [phone].              TAKE A SHOWER THE NIGHT BEFORE SURGERY AND THE DAY OF SURGERY    Please keep in mind the following:  DO NOT shave, including legs and underarms, 48 hours prior to surgery.   You may shave your face before/day of surgery.  Place clean sheets on your bed the night before surgery Use a clean washcloth (not used since being washed) for each shower. DO NOT sleep with pet's night before surgery.  CHG Shower Instructions:  Wash your face and private area with normal soap. If you choose to wash your hair, wash first with your normal shampoo.  After you use shampoo/soap, rinse your hair and body thoroughly to remove shampoo/soap residue.  Turn the water OFF and apply half the bottle of CHG soap to a CLEAN washcloth.  Apply CHG soap ONLY FROM YOUR NECK DOWN TO YOUR TOES (washing for 3-5 minutes)  DO NOT use CHG soap on face, private areas, open wounds, or sores.  Pay special attention  to the area where your surgery is being performed.  If you are having back surgery, having someone wash your back for you may be helpful. Wait 2 minutes after CHG soap is applied, then you may rinse off the CHG soap.  Pat dry with a clean towel  Put on clean pajamas    Additional instructions for the day of surgery: DO NOT APPLY any lotions, deodorants, cologne, or perfumes.   Do not wear jewelry or makeup Do not wear nail polish, gel polish, artificial nails, or any other type of covering on natural nails (fingers and toes) Do not bring valuables to the hospital. [facility] is not responsible for valuables/personal belongings. Put on clean/comfortable clothes.  Please brush your teeth.  Ask your nurse before applying any prescription medications to the skin.

## 2023-10-04 ENCOUNTER — Encounter (HOSPITAL_COMMUNITY): Payer: Self-pay

## 2023-10-04 ENCOUNTER — Encounter (HOSPITAL_COMMUNITY)
Admission: RE | Admit: 2023-10-04 | Discharge: 2023-10-04 | Disposition: A | Discharge status: Home / Self Care | Source: Ambulatory Visit | Attending: Vascular Surgery | Admitting: Vascular Surgery

## 2023-10-04 ENCOUNTER — Other Ambulatory Visit: Payer: Self-pay

## 2023-10-04 VITALS — BP 151/63 | HR 62 | Temp 97.7°F | Resp 16 | Ht 69.0 in | Wt 136.0 lb

## 2023-10-04 DIAGNOSIS — Z7982 Long term (current) use of aspirin: Secondary | ICD-10-CM | POA: Diagnosis not present

## 2023-10-04 DIAGNOSIS — Z01812 Encounter for preprocedural laboratory examination: Secondary | ICD-10-CM | POA: Insufficient documentation

## 2023-10-04 DIAGNOSIS — K0889 Other specified disorders of teeth and supporting structures: Secondary | ICD-10-CM | POA: Diagnosis not present

## 2023-10-04 DIAGNOSIS — I70211 Atherosclerosis of native arteries of extremities with intermittent claudication, right leg: Secondary | ICD-10-CM | POA: Diagnosis not present

## 2023-10-04 DIAGNOSIS — Z8249 Family history of ischemic heart disease and other diseases of the circulatory system: Secondary | ICD-10-CM | POA: Diagnosis not present

## 2023-10-04 DIAGNOSIS — Z79899 Other long term (current) drug therapy: Secondary | ICD-10-CM | POA: Diagnosis not present

## 2023-10-04 DIAGNOSIS — Z01818 Encounter for other preprocedural examination: Secondary | ICD-10-CM

## 2023-10-04 DIAGNOSIS — E7800 Pure hypercholesterolemia, unspecified: Secondary | ICD-10-CM | POA: Diagnosis not present

## 2023-10-04 DIAGNOSIS — I714 Abdominal aortic aneurysm, without rupture, unspecified: Secondary | ICD-10-CM | POA: Diagnosis not present

## 2023-10-04 DIAGNOSIS — I10 Essential (primary) hypertension: Secondary | ICD-10-CM | POA: Diagnosis not present

## 2023-10-04 DIAGNOSIS — K219 Gastro-esophageal reflux disease without esophagitis: Secondary | ICD-10-CM | POA: Diagnosis not present

## 2023-10-04 DIAGNOSIS — F17210 Nicotine dependence, cigarettes, uncomplicated: Secondary | ICD-10-CM | POA: Diagnosis not present

## 2023-10-04 DIAGNOSIS — I6529 Occlusion and stenosis of unspecified carotid artery: Secondary | ICD-10-CM | POA: Diagnosis not present

## 2023-10-04 DIAGNOSIS — Z885 Allergy status to narcotic agent status: Secondary | ICD-10-CM | POA: Diagnosis not present

## 2023-10-04 DIAGNOSIS — Z7951 Long term (current) use of inhaled steroids: Secondary | ICD-10-CM | POA: Diagnosis not present

## 2023-10-04 DIAGNOSIS — D62 Acute posthemorrhagic anemia: Secondary | ICD-10-CM | POA: Diagnosis not present

## 2023-10-04 LAB — TYPE AND SCREEN
ABO/RH(D): O POS
Antibody Screen: NEGATIVE

## 2023-10-04 LAB — URINALYSIS, ROUTINE W REFLEX MICROSCOPIC
Bilirubin Urine: NEGATIVE
Glucose, UA: NEGATIVE mg/dL
Ketones, ur: NEGATIVE mg/dL
Leukocytes,Ua: NEGATIVE
Nitrite: NEGATIVE
Protein, ur: NEGATIVE mg/dL
Specific Gravity, Urine: 1.009 (ref 1.005–1.030)
pH: 5 (ref 5.0–8.0)

## 2023-10-04 LAB — PROTIME-INR
INR: 1.1 (ref 0.8–1.2)
Prothrombin Time: 14.2 s (ref 11.4–15.2)

## 2023-10-04 LAB — COMPREHENSIVE METABOLIC PANEL WITH GFR
ALT: 11 U/L (ref 0–44)
AST: 18 U/L (ref 15–41)
Albumin: 3.9 g/dL (ref 3.5–5.0)
Alkaline Phosphatase: 77 U/L (ref 38–126)
Anion gap: 8 (ref 5–15)
BUN: 17 mg/dL (ref 8–23)
CO2: 26 mmol/L (ref 22–32)
Calcium: 9.6 mg/dL (ref 8.9–10.3)
Chloride: 102 mmol/L (ref 98–111)
Creatinine, Ser: 1.29 mg/dL — ABNORMAL HIGH (ref 0.61–1.24)
GFR, Estimated: 53 mL/min — ABNORMAL LOW (ref >60)
Glucose, Bld: 101 mg/dL — ABNORMAL HIGH (ref 70–99)
Potassium: 4.3 mmol/L (ref 3.5–5.1)
Sodium: 136 mmol/L (ref 135–145)
Total Bilirubin: 0.8 mg/dL (ref 0.0–1.2)
Total Protein: 7.1 g/dL (ref 6.5–8.1)

## 2023-10-04 LAB — APTT: aPTT: 30 s (ref 24–36)

## 2023-10-04 LAB — CBC
HCT: 44.3 % (ref 39.0–52.0)
Hemoglobin: 13.8 g/dL (ref 13.0–17.0)
MCH: 27.9 pg (ref 26.0–34.0)
MCHC: 31.2 g/dL (ref 30.0–36.0)
MCV: 89.5 fL (ref 80.0–100.0)
Platelets: 179 10*3/uL (ref 150–400)
RBC: 4.95 MIL/uL (ref 4.22–5.81)
RDW: 14.6 % (ref 11.5–15.5)
WBC: 6.9 10*3/uL (ref 4.0–10.5)
nRBC: 0 % (ref 0.0–0.2)

## 2023-10-04 LAB — SURGICAL PCR SCREEN
MRSA, PCR: NEGATIVE
Staphylococcus aureus: NEGATIVE

## 2023-10-04 NOTE — Progress Notes (Signed)
 PCP - Dr. Faustina Hood  Cardiologist - denies  PPM/ICD - n/a Device Orders -  Rep Notified -   Chest x-ray -  EKG - 09/26/23 Stress Test - 01/25/2014 ECHO - denies Cardiac Cath - denies  Sleep Study - denies CPAP -   Fasting Blood Sugar - n/a  Checks Blood Sugar _____ times a day  Last dose of GLP1 agonist- n/a   GLP1 instructions:   Blood Thinner Instructions: Aspirin  Instructions: Pt ran out of ASA supply, instructed ok to continue if he obtains more  ERAS Protcol - NPO PRE-SURGERY Ensure or G2-   COVID TEST- n/a  Reports groin is healing well after 5/12 intervention.  Patient denies shortness of breath, fever, cough and chest pain at PAT appointment

## 2023-10-05 NOTE — Progress Notes (Signed)
 Patient was called to be informed that the surgery time for tomorrow was changed to 07:35 o'clock. Patient was instructed to be at the hospital at 05:30 o'clock. NPO after midnight. Patient verbalized understanding.

## 2023-10-06 ENCOUNTER — Other Ambulatory Visit: Payer: Self-pay

## 2023-10-06 ENCOUNTER — Encounter (HOSPITAL_COMMUNITY): Payer: Self-pay | Admitting: Vascular Surgery

## 2023-10-06 ENCOUNTER — Inpatient Hospital Stay (HOSPITAL_COMMUNITY): Admitting: Physician Assistant

## 2023-10-06 ENCOUNTER — Inpatient Hospital Stay (HOSPITAL_COMMUNITY)
Admission: RE | Admit: 2023-10-06 | Discharge: 2023-10-07 | DRG: 253 | Disposition: A | Discharge status: Home / Self Care | Attending: Vascular Surgery | Admitting: Vascular Surgery

## 2023-10-06 ENCOUNTER — Encounter (HOSPITAL_COMMUNITY)
Admission: RE | Disposition: A | Discharge status: Home / Self Care | Payer: Self-pay | Source: Home / Self Care | Attending: Vascular Surgery

## 2023-10-06 ENCOUNTER — Inpatient Hospital Stay (HOSPITAL_COMMUNITY): Admitting: Anesthesiology

## 2023-10-06 DIAGNOSIS — Z7982 Long term (current) use of aspirin: Secondary | ICD-10-CM

## 2023-10-06 DIAGNOSIS — K0889 Other specified disorders of teeth and supporting structures: Secondary | ICD-10-CM | POA: Diagnosis present

## 2023-10-06 DIAGNOSIS — I10 Essential (primary) hypertension: Secondary | ICD-10-CM | POA: Diagnosis present

## 2023-10-06 DIAGNOSIS — D62 Acute posthemorrhagic anemia: Secondary | ICD-10-CM | POA: Diagnosis not present

## 2023-10-06 DIAGNOSIS — Z885 Allergy status to narcotic agent status: Secondary | ICD-10-CM | POA: Diagnosis not present

## 2023-10-06 DIAGNOSIS — I70201 Unspecified atherosclerosis of native arteries of extremities, right leg: Secondary | ICD-10-CM | POA: Diagnosis present

## 2023-10-06 DIAGNOSIS — Z8249 Family history of ischemic heart disease and other diseases of the circulatory system: Secondary | ICD-10-CM | POA: Diagnosis not present

## 2023-10-06 DIAGNOSIS — E7800 Pure hypercholesterolemia, unspecified: Secondary | ICD-10-CM | POA: Diagnosis present

## 2023-10-06 DIAGNOSIS — Z79899 Other long term (current) drug therapy: Secondary | ICD-10-CM | POA: Diagnosis not present

## 2023-10-06 DIAGNOSIS — I714 Abdominal aortic aneurysm, without rupture, unspecified: Secondary | ICD-10-CM | POA: Diagnosis present

## 2023-10-06 DIAGNOSIS — F17210 Nicotine dependence, cigarettes, uncomplicated: Secondary | ICD-10-CM

## 2023-10-06 DIAGNOSIS — I70211 Atherosclerosis of native arteries of extremities with intermittent claudication, right leg: Principal | ICD-10-CM

## 2023-10-06 DIAGNOSIS — I129 Hypertensive chronic kidney disease with stage 1 through stage 4 chronic kidney disease, or unspecified chronic kidney disease: Secondary | ICD-10-CM

## 2023-10-06 DIAGNOSIS — Z01812 Encounter for preprocedural laboratory examination: Secondary | ICD-10-CM

## 2023-10-06 DIAGNOSIS — I739 Peripheral vascular disease, unspecified: Principal | ICD-10-CM | POA: Diagnosis present

## 2023-10-06 DIAGNOSIS — N189 Chronic kidney disease, unspecified: Secondary | ICD-10-CM | POA: Diagnosis not present

## 2023-10-06 DIAGNOSIS — I6529 Occlusion and stenosis of unspecified carotid artery: Secondary | ICD-10-CM | POA: Diagnosis present

## 2023-10-06 DIAGNOSIS — K219 Gastro-esophageal reflux disease without esophagitis: Secondary | ICD-10-CM | POA: Diagnosis present

## 2023-10-06 DIAGNOSIS — Z7951 Long term (current) use of inhaled steroids: Secondary | ICD-10-CM | POA: Diagnosis not present

## 2023-10-06 HISTORY — PX: PATCH ANGIOPLASTY: SHX6230

## 2023-10-06 HISTORY — PX: ENDARTERECTOMY FEMORAL: SHX5804

## 2023-10-06 LAB — CBC
HCT: 36.3 % — ABNORMAL LOW (ref 39.0–52.0)
Hemoglobin: 11.6 g/dL — ABNORMAL LOW (ref 13.0–17.0)
MCH: 27.9 pg (ref 26.0–34.0)
MCHC: 32 g/dL (ref 30.0–36.0)
MCV: 87.3 fL (ref 80.0–100.0)
Platelets: 159 10*3/uL (ref 150–400)
RBC: 4.16 MIL/uL — ABNORMAL LOW (ref 4.22–5.81)
RDW: 14.6 % (ref 11.5–15.5)
WBC: 9.1 10*3/uL (ref 4.0–10.5)
nRBC: 0 % (ref 0.0–0.2)

## 2023-10-06 LAB — CREATININE, SERUM
Creatinine, Ser: 1.24 mg/dL (ref 0.61–1.24)
GFR, Estimated: 56 mL/min — ABNORMAL LOW (ref >60)

## 2023-10-06 LAB — ABO/RH: ABO/RH(D): O POS

## 2023-10-06 LAB — POCT ACTIVATED CLOTTING TIME: Activated Clotting Time: 256 s

## 2023-10-06 SURGERY — ENDARTERECTOMY, FEMORAL
Anesthesia: General | Site: Leg Upper | Laterality: Right

## 2023-10-06 MED ORDER — FENTANYL CITRATE (PF) 250 MCG/5ML IJ SOLN
INTRAMUSCULAR | Status: DC | PRN
  Administered 2023-10-06: 25 ug via INTRAVENOUS
  Administered 2023-10-06: 100 ug via INTRAVENOUS
  Administered 2023-10-06: 25 ug via INTRAVENOUS
  Administered 2023-10-06: 50 ug via INTRAVENOUS

## 2023-10-06 MED ORDER — UMECLIDINIUM-VILANTEROL 62.5-25 MCG/ACT IN AEPB
1.0000 | INHALATION_SPRAY | Freq: Every day | RESPIRATORY_TRACT | Status: DC
  Filled 2023-10-06: qty 14

## 2023-10-06 MED ORDER — PROPOFOL 10 MG/ML IV BOLUS
INTRAVENOUS | Status: AC
  Filled 2023-10-06: qty 20

## 2023-10-06 MED ORDER — LIDOCAINE 2% (20 MG/ML) 5 ML SYRINGE
INTRAMUSCULAR | Status: AC
  Filled 2023-10-06: qty 5

## 2023-10-06 MED ORDER — HEPARIN 6000 UNIT IRRIGATION SOLUTION
Status: AC
  Filled 2023-10-06: qty 500

## 2023-10-06 MED ORDER — FENTANYL CITRATE (PF) 100 MCG/2ML IJ SOLN
25.0000 ug | INTRAMUSCULAR | Status: DC | PRN
  Administered 2023-10-06: 50 ug via INTRAVENOUS
  Administered 2023-10-06: 25 ug via INTRAVENOUS
  Administered 2023-10-06: 50 ug via INTRAVENOUS
  Administered 2023-10-06: 25 ug via INTRAVENOUS

## 2023-10-06 MED ORDER — ONDANSETRON HCL 4 MG/2ML IJ SOLN
4.0000 mg | Freq: Four times a day (QID) | INTRAMUSCULAR | Status: DC | PRN
  Administered 2023-10-06: 4 mg via INTRAVENOUS
  Filled 2023-10-06: qty 2

## 2023-10-06 MED ORDER — ONDANSETRON HCL 4 MG/2ML IJ SOLN
INTRAMUSCULAR | Status: AC
  Filled 2023-10-06: qty 2

## 2023-10-06 MED ORDER — ONDANSETRON HCL 4 MG/2ML IJ SOLN: 4.0000 mg | Freq: Once | INTRAMUSCULAR | Status: DC | PRN

## 2023-10-06 MED ORDER — FENTANYL CITRATE (PF) 100 MCG/2ML IJ SOLN
INTRAMUSCULAR | Status: AC
  Filled 2023-10-06: qty 2

## 2023-10-06 MED ORDER — ASPIRIN 81 MG PO TBEC
81.0000 mg | DELAYED_RELEASE_TABLET | Freq: Every day | ORAL | Status: DC
  Administered 2023-10-07: 81 mg via ORAL
  Filled 2023-10-06: qty 1

## 2023-10-06 MED ORDER — DEXAMETHASONE SODIUM PHOSPHATE 10 MG/ML IJ SOLN
INTRAMUSCULAR | Status: AC
  Filled 2023-10-06: qty 1

## 2023-10-06 MED ORDER — EPHEDRINE SULFATE-NACL 50-0.9 MG/10ML-% IV SOSY
PREFILLED_SYRINGE | INTRAVENOUS | Status: DC | PRN
  Administered 2023-10-06: 5 mg via INTRAVENOUS

## 2023-10-06 MED ORDER — HEMOSTATIC AGENTS (NO CHARGE) OPTIME
TOPICAL | Status: DC | PRN
Start: 2023-10-06 — End: 2023-10-06
  Administered 2023-10-06: 1 via TOPICAL

## 2023-10-06 MED ORDER — HYDROMORPHONE HCL 1 MG/ML IJ SOLN
0.5000 mg | INTRAMUSCULAR | Status: DC | PRN
  Administered 2023-10-06: 1 mg via INTRAVENOUS

## 2023-10-06 MED ORDER — ONDANSETRON HCL 4 MG/2ML IJ SOLN
INTRAMUSCULAR | Status: DC | PRN
  Administered 2023-10-06: 4 mg via INTRAVENOUS

## 2023-10-06 MED ORDER — ALBUMIN HUMAN 5 % IV SOLN: INTRAVENOUS | Status: DC | PRN

## 2023-10-06 MED ORDER — PROTAMINE SULFATE 10 MG/ML IV SOLN
INTRAVENOUS | Status: DC | PRN
Start: 2023-10-06 — End: 2023-10-06
  Administered 2023-10-06: 25 mg via INTRAVENOUS

## 2023-10-06 MED ORDER — LACTATED RINGERS IV SOLN: INTRAVENOUS | Status: DC

## 2023-10-06 MED ORDER — HYDRALAZINE HCL 20 MG/ML IJ SOLN: 5.0000 mg | INTRAMUSCULAR | Status: DC | PRN

## 2023-10-06 MED ORDER — ACETAMINOPHEN 325 MG RE SUPP: 325.0000 mg | RECTAL | Status: DC | PRN

## 2023-10-06 MED ORDER — CEFAZOLIN SODIUM-DEXTROSE 2-4 GM/100ML-% IV SOLN
2.0000 g | INTRAVENOUS | Status: AC
  Administered 2023-10-06: 2 g via INTRAVENOUS
  Filled 2023-10-06: qty 100

## 2023-10-06 MED ORDER — CLEVIDIPINE BUTYRATE 0.5 MG/ML IV EMUL
INTRAVENOUS | Status: AC
  Filled 2023-10-06: qty 50

## 2023-10-06 MED ORDER — CEFAZOLIN SODIUM-DEXTROSE 2-4 GM/100ML-% IV SOLN
2.0000 g | Freq: Three times a day (TID) | INTRAVENOUS | Status: AC
  Administered 2023-10-06 (×2): 2 g via INTRAVENOUS
  Filled 2023-10-06: qty 100

## 2023-10-06 MED ORDER — HEPARIN 6000 UNIT IRRIGATION SOLUTION
Status: DC | PRN
  Administered 2023-10-06: 1

## 2023-10-06 MED ORDER — POLYETHYLENE GLYCOL 3350 17 G PO PACK: 17.0000 g | PACK | Freq: Every day | ORAL | Status: DC | PRN

## 2023-10-06 MED ORDER — DEXAMETHASONE SODIUM PHOSPHATE 10 MG/ML IJ SOLN
INTRAMUSCULAR | Status: DC | PRN
  Administered 2023-10-06: 5 mg via INTRAVENOUS

## 2023-10-06 MED ORDER — ROCURONIUM BROMIDE 10 MG/ML (PF) SYRINGE
PREFILLED_SYRINGE | INTRAVENOUS | Status: DC | PRN
  Administered 2023-10-06: 10 mg via INTRAVENOUS
  Administered 2023-10-06: 60 mg via INTRAVENOUS

## 2023-10-06 MED ORDER — LIDOCAINE 2% (20 MG/ML) 5 ML SYRINGE
INTRAMUSCULAR | Status: DC | PRN
  Administered 2023-10-06: 100 mg via INTRAVENOUS

## 2023-10-06 MED ORDER — ACETAMINOPHEN 10 MG/ML IV SOLN: 1000.0000 mg | Freq: Once | INTRAVENOUS | Status: DC | PRN

## 2023-10-06 MED ORDER — SIMVASTATIN 20 MG PO TABS: 20.0000 mg | ORAL_TABLET | ORAL | Status: DC

## 2023-10-06 MED ORDER — BISACODYL 5 MG PO TBEC: 5.0000 mg | DELAYED_RELEASE_TABLET | Freq: Every day | ORAL | Status: DC | PRN

## 2023-10-06 MED ORDER — FENTANYL CITRATE (PF) 250 MCG/5ML IJ SOLN
INTRAMUSCULAR | Status: AC
  Filled 2023-10-06: qty 5

## 2023-10-06 MED ORDER — LABETALOL HCL 5 MG/ML IV SOLN: 10.0000 mg | INTRAVENOUS | Status: DC | PRN

## 2023-10-06 MED ORDER — 0.9 % SODIUM CHLORIDE (POUR BTL) OPTIME
TOPICAL | Status: DC | PRN
  Administered 2023-10-06: 1000 mL

## 2023-10-06 MED ORDER — GUAIFENESIN-DM 100-10 MG/5ML PO SYRP: 15.0000 mL | ORAL_SOLUTION | ORAL | Status: DC | PRN

## 2023-10-06 MED ORDER — CHLORHEXIDINE GLUCONATE CLOTH 2 % EX PADS: 6.0000 | MEDICATED_PAD | Freq: Once | CUTANEOUS | Status: DC

## 2023-10-06 MED ORDER — CHLORHEXIDINE GLUCONATE 0.12 % MT SOLN
15.0000 mL | Freq: Once | OROMUCOSAL | Status: AC
  Administered 2023-10-06: 15 mL via OROMUCOSAL
  Filled 2023-10-06: qty 15

## 2023-10-06 MED ORDER — PROPOFOL 10 MG/ML IV BOLUS
INTRAVENOUS | Status: DC | PRN
  Administered 2023-10-06: 60 mg via INTRAVENOUS

## 2023-10-06 MED ORDER — SODIUM CHLORIDE 0.9 % IV SOLN: INTRAVENOUS | Status: DC

## 2023-10-06 MED ORDER — NICOTINE 14 MG/24HR TD PT24: 14.0000 mg | MEDICATED_PATCH | Freq: Every day | TRANSDERMAL | Status: DC | PRN

## 2023-10-06 MED ORDER — HEPARIN SODIUM (PORCINE) 1000 UNIT/ML IJ SOLN
INTRAMUSCULAR | Status: DC | PRN
Start: 2023-10-06 — End: 2023-10-06
  Administered 2023-10-06: 6000 [IU] via INTRAVENOUS

## 2023-10-06 MED ORDER — SODIUM CHLORIDE 0.9 % IV SOLN: 500.0000 mL | Freq: Once | INTRAVENOUS | Status: DC | PRN

## 2023-10-06 MED ORDER — HEPARIN SODIUM (PORCINE) 5000 UNIT/ML IJ SOLN
5000.0000 [IU] | Freq: Three times a day (TID) | INTRAMUSCULAR | Status: DC
  Administered 2023-10-07: 5000 [IU] via SUBCUTANEOUS
  Filled 2023-10-06: qty 1

## 2023-10-06 MED ORDER — OXYCODONE HCL 5 MG PO TABS: 5.0000 mg | ORAL_TABLET | Freq: Once | ORAL | Status: DC | PRN

## 2023-10-06 MED ORDER — SODIUM CHLORIDE 0.9 % IV SOLN
12.5000 mg | Freq: Four times a day (QID) | INTRAVENOUS | Status: DC | PRN
  Filled 2023-10-06: qty 0.5

## 2023-10-06 MED ORDER — HYDROMORPHONE HCL 1 MG/ML IJ SOLN
INTRAMUSCULAR | Status: AC
  Filled 2023-10-06: qty 1

## 2023-10-06 MED ORDER — LOSARTAN POTASSIUM 50 MG PO TABS
50.0000 mg | ORAL_TABLET | Freq: Every day | ORAL | Status: DC
  Administered 2023-10-06 – 2023-10-07 (×2): 50 mg via ORAL
  Filled 2023-10-06 (×2): qty 1

## 2023-10-06 MED ORDER — MAGNESIUM SULFATE 2 GM/50ML IV SOLN: 2.0000 g | Freq: Every day | INTRAVENOUS | Status: DC | PRN

## 2023-10-06 MED ORDER — METOPROLOL TARTRATE 5 MG/5ML IV SOLN: 2.0000 mg | INTRAVENOUS | Status: DC | PRN

## 2023-10-06 MED ORDER — OXYCODONE HCL 5 MG PO TABS: 5.0000 mg | ORAL_TABLET | ORAL | Status: DC | PRN

## 2023-10-06 MED ORDER — SUGAMMADEX SODIUM 200 MG/2ML IV SOLN
INTRAVENOUS | Status: DC | PRN
  Administered 2023-10-06: 200 mg via INTRAVENOUS

## 2023-10-06 MED ORDER — ACETAMINOPHEN 325 MG PO TABS
325.0000 mg | ORAL_TABLET | ORAL | Status: DC | PRN
  Administered 2023-10-06: 325 mg via ORAL
  Filled 2023-10-06: qty 2

## 2023-10-06 MED ORDER — CHLORHEXIDINE GLUCONATE CLOTH 2 % EX PADS
6.0000 | MEDICATED_PAD | Freq: Once | CUTANEOUS | Status: DC
  Administered 2023-10-06: 6 via TOPICAL

## 2023-10-06 MED ORDER — OXYCODONE HCL 5 MG/5ML PO SOLN: 5.0000 mg | Freq: Once | ORAL | Status: DC | PRN

## 2023-10-06 MED ORDER — PHENOL 1.4 % MT LIQD: 1.0000 | OROMUCOSAL | Status: DC | PRN

## 2023-10-06 MED ORDER — POTASSIUM CHLORIDE CRYS ER 20 MEQ PO TBCR: 20.0000 meq | EXTENDED_RELEASE_TABLET | Freq: Every day | ORAL | Status: DC | PRN

## 2023-10-06 MED ORDER — DOCUSATE SODIUM 100 MG PO CAPS
100.0000 mg | ORAL_CAPSULE | Freq: Every day | ORAL | Status: DC
  Administered 2023-10-07: 100 mg via ORAL
  Filled 2023-10-06: qty 1

## 2023-10-06 MED ORDER — ORAL CARE MOUTH RINSE: 15.0000 mL | Freq: Once | OROMUCOSAL | Status: AC

## 2023-10-06 MED ORDER — PHENYLEPHRINE HCL-NACL 20-0.9 MG/250ML-% IV SOLN
INTRAVENOUS | Status: DC | PRN
  Administered 2023-10-06: 15 ug/min via INTRAVENOUS

## 2023-10-06 MED ORDER — PANTOPRAZOLE SODIUM 40 MG PO TBEC
40.0000 mg | DELAYED_RELEASE_TABLET | Freq: Every day | ORAL | Status: DC
  Administered 2023-10-06 – 2023-10-07 (×2): 40 mg via ORAL
  Filled 2023-10-06 (×2): qty 1

## 2023-10-06 MED ORDER — ROCURONIUM BROMIDE 10 MG/ML (PF) SYRINGE
PREFILLED_SYRINGE | INTRAVENOUS | Status: AC
  Filled 2023-10-06: qty 10

## 2023-10-06 MED ORDER — ALUM & MAG HYDROXIDE-SIMETH 200-200-20 MG/5ML PO SUSP: 15.0000 mL | ORAL | Status: DC | PRN

## 2023-10-06 SURGICAL SUPPLY — 27 items
BAG COUNTER SPONGE SURGICOUNT (BAG) ×2 IMPLANT
CANISTER SUCTION 3000ML PPV (SUCTIONS) ×2 IMPLANT
CLIP LIGATING EXTRA MED SLVR (CLIP) ×2 IMPLANT
CLIP LIGATING EXTRA SM BLUE (MISCELLANEOUS) ×2 IMPLANT
DERMABOND ADVANCED .7 DNX12 (GAUZE/BANDAGES/DRESSINGS) ×2 IMPLANT
ELECTRODE REM PT RTRN 9FT ADLT (ELECTROSURGICAL) ×2 IMPLANT
EVACUATOR SILICONE 100CC (DRAIN) IMPLANT
GLOVE BIO SURGEON STRL SZ7.5 (GLOVE) ×2 IMPLANT
GOWN STRL REUS W/ TWL LRG LVL3 (GOWN DISPOSABLE) ×4 IMPLANT
GOWN STRL REUS W/ TWL XL LVL3 (GOWN DISPOSABLE) ×2 IMPLANT
KIT BASIN OR (CUSTOM PROCEDURE TRAY) ×2 IMPLANT
KIT TURNOVER KIT B (KITS) ×2 IMPLANT
NS IRRIG 1000ML POUR BTL (IV SOLUTION) ×4 IMPLANT
PACK PERIPHERAL VASCULAR (CUSTOM PROCEDURE TRAY) ×2 IMPLANT
PAD ARMBOARD POSITIONER FOAM (MISCELLANEOUS) ×4 IMPLANT
PATCH VASC XENOSURE 1X6 (Vascular Products) IMPLANT
POWDER SURGICEL 3.0 GRAM (HEMOSTASIS) IMPLANT
SUT ETHILON 3 0 PS 1 (SUTURE) IMPLANT
SUT MNCRL AB 4-0 PS2 18 (SUTURE) ×2 IMPLANT
SUT PROLENE 5 0 C 1 24 (SUTURE) ×2 IMPLANT
SUT PROLENE 6 0 BV (SUTURE) ×2 IMPLANT
SUT VIC AB 2-0 CT1 TAPERPNT 27 (SUTURE) ×2 IMPLANT
SUT VIC AB 3-0 SH 27X BRD (SUTURE) ×2 IMPLANT
TOWEL GREEN STERILE (TOWEL DISPOSABLE) ×4 IMPLANT
TOWEL GREEN STERILE FF (TOWEL DISPOSABLE) ×2 IMPLANT
UNDERPAD 30X36 HEAVY ABSORB (UNDERPADS AND DIAPERS) ×2 IMPLANT
WATER STERILE IRR 1000ML POUR (IV SOLUTION) ×2 IMPLANT

## 2023-10-06 NOTE — Anesthesia Procedure Notes (Addendum)
 Procedure Name: Intubation Date/Time: 10/06/2023 7:42 AM  Performed by: Deneice Finland, CRNAPre-anesthesia Checklist: Patient identified, Emergency Drugs available, Suction available and Patient being monitored Patient Re-evaluated:Patient Re-evaluated prior to induction Oxygen Delivery Method: Circle System Utilized Preoxygenation: Pre-oxygenation with 100% oxygen Induction Type: IV induction Ventilation: Mask ventilation without difficulty Laryngoscope Size: Mac and 3 Grade View: Grade II Tube type: Oral Tube size: 7.5 mm Number of attempts: 1 Airway Equipment and Method: Stylet and Oral airway Placement Confirmation: ETT inserted through vocal cords under direct vision, positive ETCO2 and breath sounds checked- equal and bilateral Secured at: 23 cm Tube secured with: Tape Dental Injury: Teeth and Oropharynx as per pre-operative assessment

## 2023-10-06 NOTE — Op Note (Signed)
    Patient name: [name]. 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 Vikki Graves, MD Assistant: Aubry Blase, [place] Procedure Performed: Right common femoral endarterectomy with bovine pericardial patch angioplasty  Indications: 89 year old male with symptomatic atherosclerosis of his native arteries with life-limiting claudication in the right lower extremity with evidence of subtotal occlusion of his right common femoral artery and occlusion of his popliteal artery distally with reconstitution of a posterior tibial artery dominant runoff to the foot.  Experience assistant was necessary to facilitate exposure of the common femoral artery and its branches as well as performing extensive endarterectomy and patch angioplasty.  Findings: Common femoral artery appeared occluded with a focal 1.5 cm lesion that appeared to be a previously ruptured plaque.  There was extensive thickening in the artery and we performed endarterectomy including the distal external iliac artery down to the profunda and superficial femoral arteries.  At completion there were low resistance signals in both the SFA proximally and the profunda.   Procedure:  The patient was identified in the holding area and taken to the operating room where he was placed supine on the operating table and general anesthesia was induced.  He was sterilely prepped and draped in the right groin in usual fashion, antibiotics were administered and a timeout was called.  I could palpate the common femoral artery and there was actually a pulse above the inguinal ligament and down below this there was no pulse but it was easily palpable.  Vertical incision was.  I dissected down to the common femoral artery where he remained pulseless and heavily calcified.  We extended our incision  up dissected underneath the inguinal ligament divided the crossing vein and placed a vessel loop around the extrailiac artery as well as the medial and lateral circumflex branches.  We dissected further down and isolated the superficial femoral and profunda branches as well and the patient was fully heparinized.  After heparin  had circulated for 2 minutes we clamped the outflow followed by the inflow and then opened the vessel longitudinally and extended this with Potts scissors.  There was a focal 50 mm lesion that appeared to be a previously thrombosed plaque.  Endarterectomy was performed up to the extrailiac artery revealed smooth tapering into the inflow and then down to the profunda and SFA we also were able to get smooth tapering distally.  We then prepared a bovine pericardial patch trimmed to size and sewn this in place with 5-0 Prolene suture.  Prior completion without flushing all directions.  We also irrigated with heparinized saline.  Upon completion we then released the clamps allowed flow into the profunda followed by the SFA.  There was brisk flow in both the SFA and profunda which appeared to be low resistance by Doppler.  25 mg of protamine was administered we obtain hemostasis and closed in layers of Vicryl and Monocryl.  Dermabond was placed at the skin level.  The patient was awakened from anesthesia having tolerated the procedure without any complication.  All counts were correct at completion.   EBL: 100cc     Ataya Murdy C. Vikki Graves, MD [facility] Office: [phone] Pager: [phone]

## 2023-10-06 NOTE — Anesthesia Procedure Notes (Addendum)
 Arterial Line Insertion Start/End5/22/2025 7:00 AM, 10/06/2023 7:15 AM Performed by: Leslye Rast, MD, Deneice Finland, CRNA, anesthesiologist  Patient location: Pre-op. Preanesthetic checklist: patient identified, IV checked, site marked, risks and benefits discussed, surgical consent, monitors and equipment checked, pre-op evaluation, timeout performed and anesthesia consent Lidocaine  1% used for infiltration Right, radial was placed Catheter size: 20 G Hand hygiene performed  and maximum sterile barriers used   Attempts: 2 Procedure performed using ultrasound guided technique. Following insertion, dressing applied. Post procedure assessment: normal and unchanged

## 2023-10-06 NOTE — H&P (Signed)
 HPI:   [name] is a 89 y.o. male with history of history of claudication in his bilateral lower extremities.  He states that the right is greater than the left and gives him pain below his knee even with taking the trash to the end of the driveway.  He states that he does not do much walking due to the pain.  He denies any tissue loss or ulceration.  He continues to smoke approximately half pack per day.  He does take aspirin  and a statin.  He has additional risk factors of hypercholesterolemia and hypertension.       Past Medical History:  Diagnosis Date   AAA (abdominal aortic aneurysm) (HCC)     Aortic aneurysm (HCC)     Carotid artery occlusion     GERD (gastroesophageal reflux disease)     Hypercholesteremia     Hypertension               Family History  Problem Relation Age of Onset   Heart disease Mother     Heart attack Sister               Past Surgical History:  Procedure Laterality Date   HERNIA REPAIR       ORTHOPEDIC SURGERY Left      wrist          Short Social History:  Social History         Tobacco Use   Smoking status: Every Day      Current packs/day: 0.50      Types: Cigarettes      Passive exposure: Never   Smokeless tobacco: Current  Substance Use Topics   Alcohol use: Yes      Allergies       Allergies  Allergen Reactions   Codeine        Hallucinations              Current Outpatient Medications  Medication Sig Dispense Refill   aspirin  EC 81 MG tablet Take 81 mg by mouth daily. Swallow whole.       losartan (COZAAR) 25 MG tablet Take 25 mg by mouth daily.       pantoprazole (PROTONIX) 40 MG tablet Take 40 mg by mouth daily.       simvastatin (ZOCOR) 20 MG tablet Take 20 mg by mouth every evening.          No current facility-administered medications for this visit.        Review of Systems  Constitutional:  Constitutional negative. HENT: HENT negative.  Eyes: Eyes negative.  Respiratory: Respiratory  negative.  Cardiovascular: Positive for claudication.  GI: Gastrointestinal negative.  Musculoskeletal: Musculoskeletal negative. Positive for leg pain.  Skin: Skin negative.  Neurological: Neurological negative. Hematologic: Hematologic/lymphatic negative.  Psychiatric: Psychiatric negative.          Objective:   Vitals:   10/06/23 0556  BP: (!) 149/91  Pulse: 66  Resp: 16  Temp: 98 F (36.7 C)  SpO2: 100%       Physical Exam HENT:     Head: Normocephalic.     Nose: Nose normal.  Eyes:     Pupils: Pupils are equal, round, and reactive to light.  Cardiovascular:     Rate and Rhythm: Normal rate.     Pulses:          Femoral pulses are 1+ on the right side and 2+ on the left side.  Popliteal pulses are 0 on the right side and 2+ on the left side.       Dorsalis pedis pulses are 0 on the right side.       Posterior tibial pulses are 0 on the right side and 2+ on the left side.  Pulmonary:     Effort: Pulmonary effort is normal.  Abdominal:     General: Abdomen is flat.  Musculoskeletal:        General: Normal range of motion.     Cervical back: Normal range of motion and neck supple.     Right lower leg: No edema.     Left lower leg: No edema.  Skin:    General: Skin is warm.     Capillary Refill: Capillary refill takes 2 to 3 seconds.  Neurological:     General: No focal deficit present.     Mental Status: He is alert.  Psychiatric:        Mood and Affect: Mood normal.        Thought Content: Thought content normal.        Judgment: Judgment normal.        Data: +-----------+--------+-----+---------------+----------+--------+  RIGHT     PSV cm/sRatioStenosis       Waveform  Comments  +-----------+--------+-----+---------------+----------+--------+  CFA Distal 430          75-99% stenosisbiphasic            +-----------+--------+-----+---------------+----------+--------+  DFA       33                          monophasic           +-----------+--------+-----+---------------+----------+--------+  SFA Prox   408          75-99% stenosismonophasic          +-----------+--------+-----+---------------+----------+--------+  SFA Mid    53                          biphasic            +-----------+--------+-----+---------------+----------+--------+  SFA Distal 50                          monophasic          +-----------+--------+-----+---------------+----------+--------+  POP Prox   31                          monophasic          +-----------+--------+-----+---------------+----------+--------+  POP Distal              occluded                           +-----------+--------+-----+---------------+----------+--------+  ATA Distal              occluded                           +-----------+--------+-----+---------------+----------+--------+  PTA Distal 17                          monophasic          +-----------+--------+-----+---------------+----------+--------+  PERO Distal18  monophasic          +-----------+--------+-----+---------------+----------+--------+   Summary:  Right: 75-99% stenosis noted in the common femoral artery. 75-99% stenosis  noted in the superficial femoral artery. Total occlusion noted in the  popliteal artery. Total occlusion noted in the anterior tibial artery.    ABI Findings:  +--------+------------------+-----+----------+--------+  Right  Rt Pressure (mmHg)IndexWaveform  Comment   +--------+------------------+-----+----------+--------+  [id number]                                       +--------+------------------+-----+----------+--------+  PTA    86                0.58 monophasic          +--------+------------------+-----+----------+--------+  DP     73                0.49 monophasic          +--------+------------------+-----+----------+--------+    +---------+------------------+-----+---------+-------+  Left    Lt Pressure (mmHg)IndexWaveform Comment  +---------+------------------+-----+---------+-------+  Brachial 147                                      +---------+------------------+-----+---------+-------+  PTA     149               1.01 triphasic         +---------+------------------+-----+---------+-------+  DP      151               1.02 triphasic         +---------+------------------+-----+---------+-------+  Great Toe113               0.76                   +---------+------------------+-----+---------+-------+   +-------+-----------+-----------+------------+------------+  ABI/TBIToday's ABIToday's TBIPrevious ABIPrevious TBI  +-------+-----------+-----------+------------+------------+  Right 0.58       0.0        0.61        0.22          +-------+-----------+-----------+------------+------------+  Left  1.02       0.76       1.16        0.58          +-------+-----------+-----------+------------+------------+      Arterial wall calcification precludes accurate ankle pressures and ABIs.    Summary:  Right: Resting right ankle-brachial index indicates moderate right lower  extremity arterial disease. The right toe-brachial index is abnormal.   Left: Resting left ankle-brachial index is within normal range. The left  toe-brachial index is normal.       Assessment/Plan:    89 year old male with short distance life limiting claudication with mildly depressed right lower extremity ABIs with severe common femoral disease. Plan for Right common femoral endarterectomy today in the OR. Risks, benefits and alternatives discussed and consent signed.    Lamonica Trueba C. Vikki Graves, MD [facility] Office: [phone] Pager: [phone]

## 2023-10-06 NOTE — Anesthesia Postprocedure Evaluation (Signed)
 Anesthesia Post Note  Patient: [name].  Procedure(s) Performed: ENDARTERECTOMY, FEMORAL (Right) ANGIOPLASTY, USING PATCH GRAFT, USING XENOSURE SIZE 1CM X 6CM (Right: Leg Upper)     Patient location during evaluation: PACU Anesthesia Type: General Level of consciousness: awake and alert Pain management: pain level controlled Vital Signs Assessment: post-procedure vital signs reviewed and stable Respiratory status: spontaneous breathing, nonlabored ventilation, respiratory function stable and patient connected to nasal cannula oxygen Cardiovascular status: blood pressure returned to baseline and stable Postop Assessment: no apparent nausea or vomiting Anesthetic complications: no   No notable events documented.  Last Vitals:  Vitals:   10/06/23 1300 10/06/23 1422  BP: (!) 122/52 124/62  Pulse: (!) 56 62  Resp: 18 18  Temp:  36.4 C  SpO2: 99% 100%    Last Pain:  Vitals:   10/06/23 1507  TempSrc:   PainSc: 3                  Leslye Rast

## 2023-10-06 NOTE — Plan of Care (Signed)

## 2023-10-06 NOTE — Transfer of Care (Signed)
 Immediate Anesthesia Transfer of Care Note  Patient: [name].  Procedure(s) Performed: ENDARTERECTOMY, FEMORAL (Right) ANGIOPLASTY, USING PATCH GRAFT, USING XENOSURE SIZE 1CM X 6CM (Right: Leg Upper)  Patient Location: PACU  Anesthesia Type:General  Level of Consciousness: awake and alert   Airway & Oxygen Therapy: Patient Spontanous Breathing and Patient connected to face mask oxygen  Post-op Assessment: Report given to RN and Post -op Vital signs reviewed and stable  Post vital signs: Reviewed and stable  Last Vitals:  Vitals Value Taken Time  BP 121/55 10/06/23 0931  Temp 36.5 C 10/06/23 0930  Pulse 68 10/06/23 0935  Resp 32 10/06/23 0935  SpO2 100 % 10/06/23 0935  Vitals shown include unfiled device data.  Last Pain:  Vitals:   10/06/23 0619  TempSrc:   PainSc: 0-No pain      Patients Stated Pain Goal: 0 (10/06/23 2956)  Complications: No notable events documented.

## 2023-10-06 NOTE — Anesthesia Preprocedure Evaluation (Signed)
 Anesthesia Evaluation  Patient identified by MRN, date of birth, ID band Patient awake    Reviewed: Allergy & Precautions, NPO status , Patient's Chart, lab work & pertinent test results, reviewed documented beta blocker date and time   History of Anesthesia Complications Negative for: history of anesthetic complications  Airway Mallampati: II  TM Distance: >3 FB     Dental  (+) Missing, Poor Dentition   Pulmonary neg shortness of breath, neg COPD, Current Smoker and Patient abstained from smoking.   breath sounds clear to auscultation       Cardiovascular hypertension, + Peripheral Vascular Disease  (-) CAD, (-) Past MI and (-) Cardiac Stents  Rhythm:Regular Rate:Normal     Neuro/Psych neg Seizures    GI/Hepatic ,GERD  ,,(+) neg Cirrhosis        Endo/Other    Renal/GU CRFRenal disease     Musculoskeletal   Abdominal   Peds  Hematology   Anesthesia Other Findings   Reproductive/Obstetrics                             Anesthesia Physical Anesthesia Plan  ASA: 3  Anesthesia Plan: General   Post-op Pain Management:    Induction: Intravenous  PONV Risk Score and Plan: 2 and Ondansetron  and Dexamethasone  Airway Management Planned: Oral ETT  Additional Equipment: Arterial line  Intra-op Plan:   Post-operative Plan: Extubation in OR  Informed Consent: I have reviewed the patients History and Physical, chart, labs and discussed the procedure including the risks, benefits and alternatives for the proposed anesthesia with the patient or authorized representative who has indicated his/her understanding and acceptance.     Dental advisory given  Plan Discussed with: CRNA  Anesthesia Plan Comments:        Anesthesia Quick Evaluation

## 2023-10-07 ENCOUNTER — Encounter (HOSPITAL_COMMUNITY): Payer: Self-pay | Admitting: Vascular Surgery

## 2023-10-07 DIAGNOSIS — Z9889 Other specified postprocedural states: Secondary | ICD-10-CM

## 2023-10-07 LAB — LIPID PANEL
Cholesterol: 147 mg/dL (ref 0–200)
HDL: 42 mg/dL (ref >40)
LDL Cholesterol: 86 mg/dL (ref 0–99)
Total CHOL/HDL Ratio: 3.5 ratio
Triglycerides: 93 mg/dL (ref <150)
VLDL: 19 mg/dL (ref 0–40)

## 2023-10-07 LAB — CBC
HCT: 31.3 % — ABNORMAL LOW (ref 39.0–52.0)
Hemoglobin: 10.4 g/dL — ABNORMAL LOW (ref 13.0–17.0)
MCH: 28.5 pg (ref 26.0–34.0)
MCHC: 33.2 g/dL (ref 30.0–36.0)
MCV: 85.8 fL (ref 80.0–100.0)
Platelets: 151 10*3/uL (ref 150–400)
RBC: 3.65 MIL/uL — ABNORMAL LOW (ref 4.22–5.81)
RDW: 14.6 % (ref 11.5–15.5)
WBC: 11.5 10*3/uL — ABNORMAL HIGH (ref 4.0–10.5)
nRBC: 0 % (ref 0.0–0.2)

## 2023-10-07 LAB — BASIC METABOLIC PANEL WITH GFR
Anion gap: 5 (ref 5–15)
BUN: 25 mg/dL — ABNORMAL HIGH (ref 8–23)
CO2: 22 mmol/L (ref 22–32)
Calcium: 8.6 mg/dL — ABNORMAL LOW (ref 8.9–10.3)
Chloride: 108 mmol/L (ref 98–111)
Creatinine, Ser: 1.27 mg/dL — ABNORMAL HIGH (ref 0.61–1.24)
GFR, Estimated: 54 mL/min — ABNORMAL LOW (ref >60)
Glucose, Bld: 110 mg/dL — ABNORMAL HIGH (ref 70–99)
Potassium: 4 mmol/L (ref 3.5–5.1)
Sodium: 135 mmol/L (ref 135–145)

## 2023-10-07 MED ORDER — OXYCODONE-ACETAMINOPHEN 5-325 MG PO TABS: 1.0000 | ORAL_TABLET | Freq: Four times a day (QID) | ORAL | 0 refills | Status: AC | PRN

## 2023-10-07 NOTE — Progress Notes (Signed)
Discharge instructions provided to patient. All medications, follow up appointments, and discharge instructions discussed. IV out. Monitor off CCMD notified. Discharging home 

## 2023-10-07 NOTE — Progress Notes (Signed)
   10/07/23 1129  TOC Brief Assessment  Insurance and Status Reviewed  Patient has primary care physician Yes  Home environment has been reviewed home w/ spouse  Prior level of function: self/independent  Prior/Current Home Services No current home services  Social Drivers of Health Review SDOH reviewed no interventions necessary  Readmission risk has been reviewed Yes  Transition of care needs no transition of care needs at this time    Pt stable for transition home today, CM notified by [facility] pt has [facility] office referral for any HH needs. Per PT/OT evals this am no HH f/u needed. Liaison notified no HH needs for discharge. Family to transport home.

## 2023-10-07 NOTE — Plan of Care (Signed)
 ?  Problem: Clinical Measurements: ?Goal: Will remain free from infection ?Outcome: Progressing ?  ?

## 2023-10-07 NOTE — Discharge Instructions (Signed)

## 2023-10-07 NOTE — Progress Notes (Addendum)
  Progress Note    10/07/2023 6:46 AM 1 Day Post-Op  Subjective:  no complaints; wants to go home  Afebrile HR 50's-70's NSR 90's-110's systolic 99% RA  Vitals:   10/06/23 2324 10/07/23 0319  BP: 112/60 (!) 111/48  Pulse: 70 62  Resp: 14 19  Temp: 97.8 F (36.6 C) 98 F (36.7 C)  SpO2: 100% 99%    Physical Exam: General:  no distress Lungs:  non labored Incisions:  looks fine Extremities:  brisk doppler flow right PT   CBC    Component Value Date/Time   WBC 11.5 (H) 10/07/2023 0500   RBC 3.65 (L) 10/07/2023 0500   HGB 10.4 (L) 10/07/2023 0500   HCT 31.3 (L) 10/07/2023 0500   PLT 151 10/07/2023 0500   MCV 85.8 10/07/2023 0500   MCH 28.5 10/07/2023 0500   MCHC 33.2 10/07/2023 0500   RDW 14.6 10/07/2023 0500   LYMPHSABS 2.1 11/06/2014 2100   MONOABS 0.6 11/06/2014 2100   EOSABS 0.2 11/06/2014 2100   BASOSABS 0.0 11/06/2014 2100    BMET    Component Value Date/Time   NA 135 10/07/2023 0500   K 4.0 10/07/2023 0500   CL 108 10/07/2023 0500   CO2 22 10/07/2023 0500   GLUCOSE 110 (H) 10/07/2023 0500   BUN 25 (H) 10/07/2023 0500   CREATININE 1.27 (H) 10/07/2023 0500   CALCIUM 8.6 (L) 10/07/2023 0500   GFRNONAA 54 (L) 10/07/2023 0500   GFRAA 52 (L) 02/22/2017 2356    INR    Component Value Date/Time   INR 1.1 10/04/2023 1027     Intake/Output Summary (Last 24 hours) at 10/07/2023 0646 Last data filed at 10/07/2023 0327 Gross per 24 hour  Intake 722.03 ml  Output 675 ml  Net 47.03 ml      Assessment/Plan:  89 y.o. male is s/p:   Right common femoral endarterectomy with bovine pericardial patch angioplasty on 10/06/2023 by Dr. Vikki Graves  1 Day Post-Op   -pt with brisk doppler flow right PT and incision looks good -pt needs to mobilize this morning.  PT and OT ordered.  I changed to imminent discharge as he will probably discharge later today.   -acute surgical blood loss anemia-hgb 10.4 down from 13.8 pre-op -DVT prophylaxis:  sq  heparin  -continue asa/statin   Maryanna Smart, PA-C [facility] [phone] 10/07/2023 6:46 AM   I have interviewed and examined patient with PA and agree with assessment and plan above.   Cyle Kenyon C. Vikki Graves, MD [facility] [place] Office: [phone] Pager: [phone]

## 2023-10-07 NOTE — Evaluation (Signed)
 Physical Therapy Evaluation and Discharge  Patient Details Name: [name]. MRN: [id number] DOB: 31-Dec-1933 Today's Date: 10/07/2023  History of Present Illness  Patient is a 89 year old male s/p right femoral endarterectomy with angioplasty. PMH: HTN, PVD.  Clinical Impression  Patient is agreeable to PT evaluation. He reports he is independent at baseline without assistive device for ambulation. He lives with his spouse and has family support.  Today the patient is Mod I with bed mobility. Supervision for transfers and hallway ambulation without assistive device. Patient reports no pain. No dizziness with mobility and vitals stable throughout session. Patient is eager to be discharged home. No anticipate PT needs at this time. PT will sign off.       If plan is discharge home, recommend the following: Assist for transportation   Can travel by private vehicle        Equipment Recommendations None recommended by PT  Recommendations for Other Services       Functional Status Assessment Patient has not had a recent decline in their functional status     Precautions / Restrictions Precautions Precautions: Fall (low fall risk) Restrictions Weight Bearing Restrictions Per Provider Order: No      Mobility  Bed Mobility Overal bed mobility: Modified Independent                  Transfers Overall transfer level: Needs assistance Equipment used: None Transfers: Sit to/from Stand Sit to Stand: Supervision           General transfer comment: supervision for safety, no physical assistance required    Ambulation/Gait Ambulation/Gait assistance: Supervision Gait Distance (Feet): 200 Feet Assistive device: None Gait Pattern/deviations: Step-through pattern, Decreased stance time - right Gait velocity: decreased     General Gait Details: mild decreased stance time on RLE with no loss of balance without device. no dizziness reported. Sp02 in the 90's on room  air  Stairs            Wheelchair Mobility     Tilt Bed    Modified Rankin (Stroke Patients Only)       Balance Overall balance assessment: Mild deficits observed, not formally tested                                           Pertinent Vitals/Pain Pain Assessment Pain Assessment: No/denies pain    Home Living Family/patient expects to be discharged to:: Private residence Living Arrangements: Spouse/significant other Available Help at Discharge: Family;Available 24 hours/day Type of Home: House Home Access: Level entry       Home Layout: One level Home Equipment: Cane - single point      Prior Function Prior Level of Function : Independent/Modified Independent;History of Falls (last six months)             Mobility Comments: independent without device ADLs Comments: independent     Extremity/Trunk Assessment   Upper Extremity Assessment Upper Extremity Assessment: Overall WFL for tasks assessed    Lower Extremity Assessment Lower Extremity Assessment: Overall WFL for tasks assessed       Communication   Communication Communication: No apparent difficulties    Cognition Arousal: Alert Behavior During Therapy: WFL for tasks assessed/performed   PT - Cognitive impairments: No apparent impairments  Following commands: Intact       Cueing Cueing Techniques: Verbal cues     General Comments      Exercises     Assessment/Plan    PT Assessment Patient does not need any further PT services  PT Problem List         PT Treatment Interventions      PT Goals (Current goals can be found in the Care Plan section)  Acute Rehab PT Goals PT Goal Formulation: All assessment and education complete, DC therapy    Frequency       Co-evaluation               AM-PAC PT "6 Clicks" Mobility  Outcome Measure Help needed turning from your back to your side while in a flat bed without  using bedrails?: None Help needed moving from lying on your back to sitting on the side of a flat bed without using bedrails?: None Help needed moving to and from a bed to a chair (including a wheelchair)?: A Little Help needed standing up from a chair using your arms (e.g., wheelchair or bedside chair)?: A Little Help needed to walk in hospital room?: A Little Help needed climbing 3-5 steps with a railing? : A Little 6 Click Score: 20    End of Session Equipment Utilized During Treatment: Gait belt Activity Tolerance: Patient tolerated treatment well Patient left: in bed;with call bell/phone within reach;with family/visitor present (wife and daughter present) Nurse Communication: Mobility status      Time: 1610-9604 PT Time Calculation (min) (ACUTE ONLY): 17 min   Charges:   PT Evaluation $PT Eval Low Complexity: 1 Low   PT General Charges $$ ACUTE PT VISIT: 1 Visit         Ozie Bo, PT, MPT   Erlene Hawks 10/07/2023, 9:06 AM

## 2023-10-07 NOTE — Discharge Summary (Signed)
 Discharge Summary     [name]. August 01, 1933 89 y.o. male  [id number]  Admission Date: 10/06/2023  Discharge Date: 10/07/2023  Physician: Shera Diego*  Admission Diagnosis: Atherosclerosis of native artery of right lower extremity with intermittent claudication (HCC) [I70.211] Atherosclerosis of right lower extremity (HCC) [I70.201] PAD (peripheral artery disease) (HCC) [I73.9]  HPI:   This is a 89 y.o. male  with symptomatic atherosclerosis of his native arteries with life-limiting claudication in the right lower extremity with evidence of subtotal occlusion of his right common femoral artery and occlusion of his popliteal artery distally with reconstitution of a posterior tibial artery dominant runoff to the foot.   Experience assistant was necessary to facilitate exposure of the common femoral artery and its branches as well as performing extensive endarterectomy and patch angioplasty.  Hospital Course:  The patient was admitted to the hospital and taken to the operating room on 10/06/2023 and underwent: Right common femoral endarterectomy with bovine pericardial patch angioplasty     Findings: Common femoral artery appeared occluded with a focal 1.5 cm lesion that appeared to be a previously ruptured plaque. There was extensive thickening in the artery and we performed endarterectomy including the distal external iliac artery down to the profunda and superficial femoral arteries. At completion there were low resistance signals in both the SFA proximally and the profunda.   The pt tolerated the procedure well and was transported to the PACU in good condition.   By POD 1, pt had brisk doppler flow and incision looks good.  He worked with PT and no recommendations.  He had acute surgical blood loss anemia and he was tolerating this well and did not require transfusion.    He is discharged home.    CBC    Component Value Date/Time   WBC 11.5 (H) 10/07/2023  0500   RBC 3.65 (L) 10/07/2023 0500   HGB 10.4 (L) 10/07/2023 0500   HCT 31.3 (L) 10/07/2023 0500   PLT 151 10/07/2023 0500   MCV 85.8 10/07/2023 0500   MCH 28.5 10/07/2023 0500   MCHC 33.2 10/07/2023 0500   RDW 14.6 10/07/2023 0500   LYMPHSABS 2.1 11/06/2014 2100   MONOABS 0.6 11/06/2014 2100   EOSABS 0.2 11/06/2014 2100   BASOSABS 0.0 11/06/2014 2100    BMET    Component Value Date/Time   NA 135 10/07/2023 0500   K 4.0 10/07/2023 0500   CL 108 10/07/2023 0500   CO2 22 10/07/2023 0500   GLUCOSE 110 (H) 10/07/2023 0500   BUN 25 (H) 10/07/2023 0500   CREATININE 1.27 (H) 10/07/2023 0500   CALCIUM 8.6 (L) 10/07/2023 0500   GFRNONAA 54 (L) 10/07/2023 0500   GFRAA 52 (L) 02/22/2017 2356     Discharge Instructions     Discharge patient   Complete by: As directed    Discharge disposition: 01-Home or Self Care   Discharge patient date: 10/07/2023       Discharge Diagnosis:  Atherosclerosis of native artery of right lower extremity with intermittent claudication (HCC) [I70.211] Atherosclerosis of right lower extremity (HCC) [I70.201] PAD (peripheral artery disease) (HCC) [I73.9]  Secondary Diagnosis: Patient Active Problem List   Diagnosis Date Noted   Atherosclerosis of right lower extremity (HCC) 10/06/2023   PAD (peripheral artery disease) (HCC) 10/06/2023   Pain in right knee 07/29/2017   Atypical chest pain 01/15/2014   Tobacco abuse 01/15/2014   GERD (gastroesophageal reflux disease) 01/15/2014   Hypercholesterolemia 01/15/2014   Past Medical History:  Diagnosis Date   AAA (abdominal aortic aneurysm) (HCC)    Aortic aneurysm (HCC)    Carotid artery occlusion    GERD (gastroesophageal reflux disease)    Hypercholesteremia    Hypertension      Allergies as of 10/07/2023       Reactions   Codeine    Hallucinations        Medication List     TAKE these medications    acetaminophen  500 MG tablet Commonly known as: TYLENOL  Take 1,000 mg by  mouth every 6 (six) hours as needed for moderate pain (pain score 4-6).   Anoro Ellipta 62.5-25 MCG/ACT Aepb Generic drug: umeclidinium-vilanterol Inhale 1 puff into the lungs daily.   aspirin  EC 81 MG tablet Take 81 mg by mouth daily. Swallow whole.   losartan 50 MG tablet Commonly known as: COZAAR Take 50 mg by mouth daily.   oxyCODONE -acetaminophen  5-325 MG tablet Commonly known as: Percocet Take 1 tablet by mouth every 6 (six) hours as needed for severe pain (pain score 7-10).   pantoprazole 40 MG tablet Commonly known as: PROTONIX Take 40 mg by mouth daily as needed (heartburn).   simvastatin 20 MG tablet Commonly known as: ZOCOR Take 20 mg by mouth every 3 (three) days.        Discharge Instructions: [facility] Discharge instructions Lower Extremity Bypass Surgery  Please refer to the following instruction for your post-procedure care. Your surgeon or physician assistant will discuss any changes with you.  Activity  You are encouraged to walk as much as you can. You can slowly return to normal activities during the month after your surgery. Avoid strenuous activity and heavy lifting until your doctor tells you it's OK. Avoid activities such as vacuuming or swinging a golf club. Do not drive until your doctor give the OK and you are no longer taking prescription pain medications. It is also normal to have difficulty with sleep habits, eating and bowel movement after surgery. These will go away with time.  Bathing/Showering  You may shower after you go home. Do not soak in a bathtub, hot tub, or swim until the incision heals completely.  Incision Care  Clean your incision with mild soap and water. Shower every day. Pat the area dry with a clean towel. You do not need a bandage unless otherwise instructed. Do not apply any ointments or creams to your incision. If you have open wounds you will be instructed how to care for them or a  visiting nurse may be arranged for you. If you have staples or sutures along your incision they will be removed at your post-op appointment. You may have skin glue on your incision. Do not peel it off. It will come off on its own in about one week.  Wash the groin wound with soap and water daily and pat dry. (No tub bath-only shower)  Then put a dry gauze or washcloth in the groin to keep this area dry to help prevent wound infection.  Do this daily and as needed.  Do not use Vaseline or neosporin on your incisions.  Only use soap and water on your incisions and then protect and keep dry.  Diet  Resume your normal diet. There are no special food restrictions following this procedure. A low fat/ low cholesterol diet is recommended for all patients with vascular disease. In order to heal from your surgery, it is CRITICAL to get adequate nutrition. Your body requires vitamins, minerals,  and protein. Vegetables are the best source of vitamins and minerals. Vegetables also provide the perfect balance of protein. Processed food has little nutritional value, so try to avoid this.  Medications  Resume taking all your medications unless your doctor or Physician Assistant tells you not to. If your incision is causing pain, you may take over-the-counter pain relievers such as acetaminophen  (Tylenol ). If you were prescribed a stronger pain medication, please aware these medication can cause nausea and constipation. Prevent nausea by taking the medication with a snack or meal. Avoid constipation by drinking plenty of fluids and eating foods with high amount of fiber, such as fruits, vegetables, and grains. Take Colace 100 mg (an over-the-counter stool softener) twice a day as needed for constipation.  Do not take Tylenol  if you are taking prescription pain medications.  Follow Up  Our office will schedule a follow up appointment 2-3 weeks following discharge.  Please call us  immediately for any of the following  conditions  Severe or worsening pain in your legs or feet while at rest or while walking Increase pain, redness, warmth, or drainage (pus) from your incision site(s) Fever of 101 degree or higher The swelling in your leg with the bypass suddenly worsens and becomes more painful than when you were in the hospital If you have been instructed to feel your graft pulse then you should do so every day. If you can no longer feel this pulse, call the office immediately. Not all patients are given this instruction.  Leg swelling is common after leg bypass surgery.  The swelling should improve over a few months following surgery. To improve the swelling, you may elevate your legs above the level of your heart while you are sitting or resting. Your surgeon or physician assistant may ask you to apply an ACE wrap or wear compression (TED) stockings to help to reduce swelling.  Reduce your risk of vascular disease  Stop smoking. If you would like help call [facility] at 1-800-QUIT-NOW ((412) 230-6484) or [place] at [phone].  Manage your cholesterol Maintain a desired weight Control your diabetes weight Control your diabetes Keep your blood pressure down  If you have any questions, please call the office at [phone]   Prescriptions given: 1.  Roxicet #8 No Refill  Disposition: home  Patient's condition: is Good  Follow up: 1. [facility] in 2-3 weeks for incision check on Dr. Vikki Graves clinic day.   Maryanna Smart, PA-C [facility] [phone] 10/07/2023  10:35 AM  - For [facility] Registry use ---   Post-op:  Wound infection: No  Graft infection: No  Transfusion: No    If yes, n/a units given New Arrhythmia: No Ipsilateral amputation: No, [ ]  Minor, [ ]  BKA, [ ]  AKA Discharge patency: [x ] Primary, [ ]  Primary assisted, [ ]  Secondary, [ ]  Occluded Patency judged by: [x ] Dopper only, [ ]  Palpable graft pulse, []  Palpable distal pulse, [ ]  ABI inc. > 0.15, [ ]   Duplex Discharge ABI: R not done, L  D/C Ambulatory Status: Ambulatory  Complications: MI: No, [ ]  Troponin only, [ ]  EKG or Clinical CHF: No Resp failure:No, [ ]  Pneumonia, [ ]  Ventilator Chg in renal function: No, [ ]  Inc. Cr > 0.5, [ ]  Temp. Dialysis,  [ ]  Permanent dialysis Stroke: No, [ ]  Minor, [ ]  Major Return to OR: No  Reason for return to OR: [ ]  Bleeding, [ ]  Infection, [ ]  Thrombosis, [ ]  Revision  Discharge medications: Statin  use:  yes ASA use:  yes Plavix use:  no Beta blocker use: no CCB use:  No ACEI use:   no ARB use:  yes Coumadin use: no

## 2023-10-07 NOTE — Evaluation (Signed)
 Occupational Therapy Evaluation Patient Details Name: [name]. MRN: [id number] DOB: [birth year] Today's Date: 10/07/2023   History of Present Illness   Patient is a 89 year old male s/p right femoral endarterectomy with angioplasty. PMH: HTN, PVD.     Clinical Impressions Patient admitted for procedure above.  No real complaints of pain, and patient is functioning at his baseline.  He will have any needed assist at home.  No acute OT needs, and no post acute OT recommended.       If plan is discharge home, recommend the following:   Assist for transportation     Functional Status Assessment   Patient has not had a recent decline in their functional status     Equipment Recommendations   None recommended by OT     Recommendations for Other Services         Precautions/Restrictions   Precautions Precautions: None Restrictions Weight Bearing Restrictions Per Provider Order: No     Mobility Bed Mobility Overal bed mobility: Modified Independent                  Transfers Overall transfer level: Independent                        Balance Overall balance assessment: Mild deficits observed, not formally tested                                         ADL either performed or assessed with clinical judgement   ADL Overall ADL's : At baseline                                             Vision Baseline Vision/History: 1 Wears glasses Patient Visual Report: No change from baseline       Perception Perception: Within Functional Limits       Praxis Praxis: WFL       Pertinent Vitals/Pain Pain Assessment Pain Assessment: No/denies pain     Extremity/Trunk Assessment Upper Extremity Assessment Upper Extremity Assessment: Overall WFL for tasks assessed   Lower Extremity Assessment Lower Extremity Assessment: Defer to PT evaluation   Cervical / Trunk Assessment Cervical / Trunk  Assessment: Normal   Communication Communication Communication: No apparent difficulties   Cognition Arousal: Alert Behavior During Therapy: WFL for tasks assessed/performed Cognition: No apparent impairments                               Following commands: Intact       Cueing  General Comments   Cueing Techniques: Verbal cues      Exercises     Shoulder Instructions      Home Living Family/patient expects to be discharged to:: Private residence Living Arrangements: Spouse/significant other Available Help at Discharge: Family;Available 24 hours/day Type of Home: House Home Access: Level entry     Home Layout: One level     Bathroom Shower/Tub: Chief Strategy Officer: Standard     Home Equipment: Cane - single point          Prior Functioning/Environment Prior Level of Function : Independent/Modified Independent;History of Falls (last six months)  Mobility Comments: independent without device ADLs Comments: independent    OT Problem List: Decreased range of motion   OT Treatment/Interventions:        OT Goals(Current goals can be found in the care plan section)   Acute Rehab OT Goals Patient Stated Goal: Returning home OT Goal Formulation: With patient Time For Goal Achievement: 10/10/23 Potential to Achieve Goals: Good   OT Frequency:       Co-evaluation              AM-PAC OT "6 Clicks" Daily Activity     Outcome Measure Help from another person eating meals?: None Help from another person taking care of personal grooming?: None Help from another person toileting, which includes using toliet, bedpan, or urinal?: None Help from another person bathing (including washing, rinsing, drying)?: None Help from another person to put on and taking off regular upper body clothing?: None Help from another person to put on and taking off regular lower body clothing?: None 6 Click Score: 24   End of  Session Nurse Communication: Mobility status  Activity Tolerance: Patient tolerated treatment well Patient left: in chair;with call bell/phone within reach  OT Visit Diagnosis: Pain Pain - Right/Left: Right Pain - part of body: Leg                Time: 1045-1102 OT Time Calculation (min): 17 min Charges:  OT General Charges $OT Visit: 1 Visit OT Evaluation $OT Eval Moderate Complexity: 1 Mod  10/07/2023  RP, OTR/L  Acute Rehabilitation Services  Office:  [phone]   Benjamen Brand 10/07/2023, 11:04 AM

## 2023-10-26 ENCOUNTER — Ambulatory Visit: Payer: Medicare Other | Admitting: Vascular Surgery

## 2023-10-26 ENCOUNTER — Encounter (HOSPITAL_COMMUNITY): Payer: Medicare Other

## 2023-10-26 DIAGNOSIS — Z0000 Encounter for general adult medical examination without abnormal findings: Secondary | ICD-10-CM | POA: Diagnosis not present

## 2023-10-26 DIAGNOSIS — E785 Hyperlipidemia, unspecified: Secondary | ICD-10-CM | POA: Diagnosis not present

## 2023-10-26 DIAGNOSIS — J439 Emphysema, unspecified: Secondary | ICD-10-CM | POA: Diagnosis not present

## 2023-10-26 DIAGNOSIS — I10 Essential (primary) hypertension: Secondary | ICD-10-CM | POA: Diagnosis not present

## 2023-10-26 DIAGNOSIS — I700 Atherosclerosis of aorta: Secondary | ICD-10-CM | POA: Diagnosis not present

## 2023-10-26 DIAGNOSIS — K219 Gastro-esophageal reflux disease without esophagitis: Secondary | ICD-10-CM | POA: Diagnosis not present

## 2023-10-26 DIAGNOSIS — I714 Abdominal aortic aneurysm, without rupture, unspecified: Secondary | ICD-10-CM | POA: Diagnosis not present

## 2023-10-26 DIAGNOSIS — F17210 Nicotine dependence, cigarettes, uncomplicated: Secondary | ICD-10-CM | POA: Diagnosis not present

## 2023-10-26 DIAGNOSIS — I739 Peripheral vascular disease, unspecified: Secondary | ICD-10-CM | POA: Diagnosis not present

## 2023-10-31 DIAGNOSIS — E119 Type 2 diabetes mellitus without complications: Secondary | ICD-10-CM | POA: Diagnosis not present

## 2023-11-01 NOTE — Progress Notes (Signed)
  POST OPERATIVE OFFICE NOTE    CC:  F/u for surgery  HPI:  This is a 89 y.o. male who is s/p Right common femoral endarterectomy with bovine pericardial patch angioplasty  on 10/06/2023 by Dr. Vikki Graves for life style limiting claudication.  He did have angiogram on 09/26/2023 by Dr. Susi Eric.   In April 2023, he had CT scan of abdomen for abdominal pain and found to have a 3.8cm AAA.    He is on asa/statin.  Pt returns today for follow up and here with his wife of 65 years.  Pt states he is doing well.  He is walking without difficulty.  Denies pain in his feet or any ulcerations.  He states that his PCP increased his Zocor  to 40mg  daily.   Allergies  Allergen Reactions   Codeine     Hallucinations    Current Outpatient Medications  Medication Sig Dispense Refill   acetaminophen  (TYLENOL ) 500 MG tablet Take 1,000 mg by mouth every 6 (six) hours as needed for moderate pain (pain score 4-6).     ANORO ELLIPTA  62.5-25 MCG/ACT AEPB Inhale 1 puff into the lungs daily.     aspirin  EC 81 MG tablet Take 81 mg by mouth daily. Swallow whole.     losartan  (COZAAR ) 50 MG tablet Take 50 mg by mouth daily.     oxyCODONE -acetaminophen  (PERCOCET) 5-325 MG tablet Take 1 tablet by mouth every 6 (six) hours as needed for severe pain (pain score 7-10). 8 tablet 0   pantoprazole  (PROTONIX ) 40 MG tablet Take 40 mg by mouth daily as needed (heartburn).     simvastatin  (ZOCOR ) 20 MG tablet Take 20 mg by mouth every 3 (three) days.     No current facility-administered medications for this visit.     ROS:  See HPI  Physical Exam:  Today's Vitals   11/02/23 0913  BP: (!) 163/65  Pulse: 64  Temp: 98.5 F (36.9 C)  TempSrc: Temporal  SpO2: 99%  Weight: 139 lb 11.2 oz (63.4 kg)  Height: 5' 9 (1.753 m)  PainSc: 0-No pain   Body mass index is 20.63 kg/m.   Incision:  right groin incision has healed nicely.  Extremities:  brisk bilateral PT doppler flow     Assessment/Plan:  This is a 89 y.o.  male who is s/p: Right common femoral endarterectomy with bovine pericardial patch angioplasty  on 10/06/2023 by Dr. Vikki Graves for life style limiting claudication.    -right groin incision is healing nicely and he has brisk doppler flow bilateral feet -continue asa/statin -f/u in 3 months with ABI and AAA duplex should be scheduled for later this year.  -discussed importance of smoking cessation.   Maryanna Smart, [facility] [phone]   Clinic MD:  Vikki Graves

## 2023-11-02 ENCOUNTER — Ambulatory Visit: Attending: Vascular Surgery | Admitting: Physician Assistant

## 2023-11-02 ENCOUNTER — Encounter: Payer: Self-pay | Admitting: Physician Assistant

## 2023-11-02 VITALS — BP 163/65 | HR 64 | Temp 98.5°F | Ht 69.0 in | Wt 139.7 lb

## 2023-11-02 DIAGNOSIS — F17200 Nicotine dependence, unspecified, uncomplicated: Secondary | ICD-10-CM

## 2023-11-02 DIAGNOSIS — I70211 Atherosclerosis of native arteries of extremities with intermittent claudication, right leg: Secondary | ICD-10-CM

## 2023-11-03 ENCOUNTER — Other Ambulatory Visit: Payer: Self-pay | Admitting: *Deleted

## 2023-11-03 DIAGNOSIS — M79672 Pain in left foot: Secondary | ICD-10-CM | POA: Diagnosis not present

## 2023-11-03 DIAGNOSIS — L03031 Cellulitis of right toe: Secondary | ICD-10-CM | POA: Diagnosis not present

## 2023-11-03 DIAGNOSIS — I70211 Atherosclerosis of native arteries of extremities with intermittent claudication, right leg: Secondary | ICD-10-CM

## 2023-11-03 DIAGNOSIS — I739 Peripheral vascular disease, unspecified: Secondary | ICD-10-CM

## 2023-11-03 DIAGNOSIS — M79671 Pain in right foot: Secondary | ICD-10-CM | POA: Diagnosis not present

## 2023-11-03 DIAGNOSIS — B351 Tinea unguium: Secondary | ICD-10-CM | POA: Diagnosis not present

## 2023-11-15 DIAGNOSIS — U071 COVID-19: Secondary | ICD-10-CM | POA: Diagnosis not present

## 2023-12-01 DIAGNOSIS — E119 Type 2 diabetes mellitus without complications: Secondary | ICD-10-CM | POA: Diagnosis not present

## 2023-12-15 DIAGNOSIS — E785 Hyperlipidemia, unspecified: Secondary | ICD-10-CM | POA: Diagnosis not present

## 2023-12-15 DIAGNOSIS — N1831 Chronic kidney disease, stage 3a: Secondary | ICD-10-CM | POA: Diagnosis not present

## 2023-12-15 DIAGNOSIS — I10 Essential (primary) hypertension: Secondary | ICD-10-CM | POA: Diagnosis not present

## 2023-12-26 DIAGNOSIS — E785 Hyperlipidemia, unspecified: Secondary | ICD-10-CM | POA: Diagnosis not present

## 2024-01-02 DIAGNOSIS — E119 Type 2 diabetes mellitus without complications: Secondary | ICD-10-CM | POA: Diagnosis not present

## 2024-01-15 DIAGNOSIS — N1831 Chronic kidney disease, stage 3a: Secondary | ICD-10-CM | POA: Diagnosis not present

## 2024-01-15 DIAGNOSIS — E785 Hyperlipidemia, unspecified: Secondary | ICD-10-CM | POA: Diagnosis not present

## 2024-01-15 DIAGNOSIS — I10 Essential (primary) hypertension: Secondary | ICD-10-CM | POA: Diagnosis not present

## 2024-01-24 DIAGNOSIS — B351 Tinea unguium: Secondary | ICD-10-CM | POA: Diagnosis not present

## 2024-01-24 DIAGNOSIS — M79672 Pain in left foot: Secondary | ICD-10-CM | POA: Diagnosis not present

## 2024-01-24 DIAGNOSIS — M79671 Pain in right foot: Secondary | ICD-10-CM | POA: Diagnosis not present

## 2024-02-02 DIAGNOSIS — E119 Type 2 diabetes mellitus without complications: Secondary | ICD-10-CM | POA: Diagnosis not present

## 2024-02-13 ENCOUNTER — Other Ambulatory Visit: Payer: Self-pay

## 2024-02-13 DIAGNOSIS — I6523 Occlusion and stenosis of bilateral carotid arteries: Secondary | ICD-10-CM

## 2024-02-13 DIAGNOSIS — I70211 Atherosclerosis of native arteries of extremities with intermittent claudication, right leg: Secondary | ICD-10-CM

## 2024-02-14 DIAGNOSIS — N1831 Chronic kidney disease, stage 3a: Secondary | ICD-10-CM | POA: Diagnosis not present

## 2024-02-14 DIAGNOSIS — I10 Essential (primary) hypertension: Secondary | ICD-10-CM | POA: Diagnosis not present

## 2024-02-14 DIAGNOSIS — E785 Hyperlipidemia, unspecified: Secondary | ICD-10-CM | POA: Diagnosis not present

## 2024-02-22 DIAGNOSIS — H35432 Paving stone degeneration of retina, left eye: Secondary | ICD-10-CM | POA: Diagnosis not present

## 2024-02-23 DIAGNOSIS — F17210 Nicotine dependence, cigarettes, uncomplicated: Secondary | ICD-10-CM | POA: Diagnosis not present

## 2024-02-23 DIAGNOSIS — J439 Emphysema, unspecified: Secondary | ICD-10-CM | POA: Diagnosis not present

## 2024-02-24 DIAGNOSIS — X32XXXD Exposure to sunlight, subsequent encounter: Secondary | ICD-10-CM | POA: Diagnosis not present

## 2024-02-24 DIAGNOSIS — L570 Actinic keratosis: Secondary | ICD-10-CM | POA: Diagnosis not present

## 2024-02-24 DIAGNOSIS — L821 Other seborrheic keratosis: Secondary | ICD-10-CM | POA: Diagnosis not present

## 2024-02-24 DIAGNOSIS — C44319 Basal cell carcinoma of skin of other parts of face: Secondary | ICD-10-CM | POA: Diagnosis not present

## 2024-02-29 ENCOUNTER — Ambulatory Visit: Admitting: Vascular Surgery

## 2024-02-29 ENCOUNTER — Ambulatory Visit (HOSPITAL_COMMUNITY)
Admission: RE | Admit: 2024-02-29 | Discharge: 2024-02-29 | Attending: Physician Assistant | Admitting: Physician Assistant

## 2024-02-29 ENCOUNTER — Encounter: Payer: Self-pay | Admitting: Vascular Surgery

## 2024-02-29 ENCOUNTER — Ambulatory Visit (HOSPITAL_COMMUNITY)
Admission: RE | Admit: 2024-02-29 | Discharge: 2024-02-29 | Disposition: A | Discharge status: Home / Self Care | Source: Ambulatory Visit | Attending: Physician Assistant | Admitting: Physician Assistant

## 2024-02-29 VITALS — BP 164/86 | HR 55 | Temp 98.0°F | Ht 69.0 in | Wt 136.0 lb

## 2024-02-29 DIAGNOSIS — I70211 Atherosclerosis of native arteries of extremities with intermittent claudication, right leg: Secondary | ICD-10-CM | POA: Diagnosis not present

## 2024-02-29 DIAGNOSIS — I714 Abdominal aortic aneurysm, without rupture, unspecified: Secondary | ICD-10-CM | POA: Diagnosis not present

## 2024-02-29 DIAGNOSIS — I6523 Occlusion and stenosis of bilateral carotid arteries: Secondary | ICD-10-CM | POA: Insufficient documentation

## 2024-02-29 DIAGNOSIS — I739 Peripheral vascular disease, unspecified: Secondary | ICD-10-CM | POA: Insufficient documentation

## 2024-02-29 LAB — VAS US ABI WITH/WO TBI
Left ABI: 0.99
Right ABI: 0.68

## 2024-02-29 NOTE — Progress Notes (Signed)
 Patient ID: Justin JAYSON Gwenith Mickey., male   DOB: [birth year], 89 y.o.   MRN: [id number]  Reason for Consult: Follow-up   Referred by Claudene Pellet, MD  Subjective:     HPI:  Justin Hallenbeck. is a 89 y.o. male underwent right common femoral endarterectomy with bovine pericardial patch for life-limiting claudication in May of this year.  He is now well-healed.  He does have known abdominal aortic aneurysm.  He states that he has returned to his usual level of health although he is not very active only occasionally playing golf.  He has no complaints related to today's visit specifically no new back or abdominal pain.  Past Medical History:  Diagnosis Date   AAA (abdominal aortic aneurysm)    Aortic aneurysm    Carotid artery occlusion    GERD (gastroesophageal reflux disease)    Hypercholesteremia    Hypertension    Family History  Problem Relation Age of Onset   Heart disease Mother    Heart attack Sister    Past Surgical History:  Procedure Laterality Date   ABDOMINAL AORTOGRAM N/A 09/26/2023   Procedure: ABDOMINAL AORTOGRAM;  Surgeon: Pearline Norman RAMAN, MD;  Location: [facility] INVASIVE CV LAB;  Service: Vascular;  Laterality: N/A;   ENDARTERECTOMY FEMORAL Right 10/06/2023   Procedure: ENDARTERECTOMY, FEMORAL;  Surgeon: Sheree Penne Bruckner, MD;  Location: [facility] OR;  Service: Vascular;  Laterality: Right;   HERNIA REPAIR     LOWER EXTREMITY ANGIOGRAPHY N/A 09/26/2023   Procedure: Lower Extremity Angiography;  Surgeon: Pearline Norman RAMAN, MD;  Location: [facility] INVASIVE CV LAB;  Service: Vascular;  Laterality: N/A;   LOWER EXTREMITY INTERVENTION N/A 09/26/2023   Procedure: LOWER EXTREMITY INTERVENTION;  Surgeon: Pearline Norman RAMAN, MD;  Location: [facility] INVASIVE CV LAB;  Service: Vascular;  Laterality: N/A;   ORTHOPEDIC SURGERY Left    wrist   PATCH ANGIOPLASTY Right 10/06/2023   Procedure: ANGIOPLASTY, USING PATCH GRAFT, USING XENOSURE SIZE 1CM X 6CM;  Surgeon: Sheree Penne Bruckner, MD;  Location:  [facility] OR;  Service: Vascular;  Laterality: Right;    Short Social History:  Social History   Tobacco Use   Smoking status: Every Day    Current packs/day: 0.50    Types: Cigarettes    Passive exposure: Never   Smokeless tobacco: Current  Substance Use Topics   Alcohol use: Yes    Allergies  Allergen Reactions   Codeine     Hallucinations    Current Outpatient Medications  Medication Sig Dispense Refill   acetaminophen  (TYLENOL ) 500 MG tablet Take 1,000 mg by mouth every 6 (six) hours as needed for moderate pain (pain score 4-6).     ANORO ELLIPTA  62.5-25 MCG/ACT AEPB Inhale 1 puff into the lungs daily.     aspirin  EC 81 MG tablet Take 81 mg by mouth daily. Swallow whole.     losartan  (COZAAR ) 50 MG tablet Take 50 mg by mouth daily.     oxyCODONE -acetaminophen  (PERCOCET) 5-325 MG tablet Take 1 tablet by mouth every 6 (six) hours as needed for severe pain (pain score 7-10). 8 tablet 0   pantoprazole  (PROTONIX ) 40 MG tablet Take 40 mg by mouth daily as needed (heartburn).     simvastatin  (ZOCOR ) 40 MG tablet 1 tablet in the evening Orally Once a day for 100 days     No current facility-administered medications for this visit.    Review of Systems  Constitutional:  Constitutional negative. HENT: HENT negative.  Eyes: Eyes negative.  Respiratory: Respiratory negative.  Cardiovascular: Cardiovascular negative.  GI: Gastrointestinal negative.  Musculoskeletal: Musculoskeletal negative.  Skin: Skin negative.  Neurological: Neurological negative. Hematologic: Hematologic/lymphatic negative.  Psychiatric: Psychiatric negative.        Objective:  Objective   Vitals:   02/29/24 0900 02/29/24 0902  BP: (!) 178/72 (!) 164/86  Pulse: (!) 55   Temp: 98 F (36.7 C)   SpO2: 99%   Weight: 136 lb (61.7 kg)   Height: 5' 9 (1.753 m)    Body mass index is 20.08 kg/m.  Physical Exam HENT:     Head: Normocephalic.     Nose: Nose normal.     Mouth/Throat:     Mouth: Mucous  membranes are moist.  Cardiovascular:     Rate and Rhythm: Normal rate.     Pulses:          Femoral pulses are 2+ on the right side and 2+ on the left side.      Popliteal pulses are 2+ on the right side and 2+ on the left side.       Dorsalis pedis pulses are 0 on the right side and 2+ on the left side.       Posterior tibial pulses are 0 on the right side and 2+ on the left side.  Pulmonary:     Effort: Pulmonary effort is normal.  Abdominal:     General: Abdomen is flat.     Palpations: Abdomen is soft. There is mass.  Musculoskeletal:        General: Normal range of motion.     Right lower leg: No edema.     Left lower leg: No edema.  Skin:    General: Skin is warm.     Capillary Refill: Capillary refill takes less than 2 seconds.  Neurological:     Mental Status: He is alert.     Data: ABI Findings:  +---------+------------------+-----+----------+--------+  Right   Rt Pressure (mmHg)IndexWaveform  Comment   +---------+------------------+-----+----------+--------+  Brachial 167                                        +---------+------------------+-----+----------+--------+  PTA     114               0.68 monophasic          +---------+------------------+-----+----------+--------+  DP      111               0.66 monophasic          +---------+------------------+-----+----------+--------+  Great Toe81                0.49                     +---------+------------------+-----+----------+--------+   +---------+------------------+-----+-----------+-------+  Left    Lt Pressure (mmHg)IndexWaveform   Comment  +---------+------------------+-----+-----------+-------+  Brachial 165                                        +---------+------------------+-----+-----------+-------+  PTA     164               0.98 multiphasic         +---------+------------------+-----+-----------+-------+  DP      165  0.99  multiphasic         +---------+------------------+-----+-----------+-------+  Great Toe91                0.54                     +---------+------------------+-----+-----------+-------+   +-------+-----------+-----------+------------+------------+  ABI/TBIToday's ABIToday's TBIPrevious ABIPrevious TBI  +-------+-----------+-----------+------------+------------+  Right 0.68       0.49       0.58        0.0           +-------+-----------+-----------+------------+------------+  Left  0.99       0.54       1.02        0.76          +-------+-----------+-----------+------------+------------+       Right ABIs appear increased compared to prior study on 09/07/23. Left ABIs  appear essentially unchanged compared to prior study on 09/07/23.    Summary:  Right: Resting right ankle-brachial index indicates moderate right lower  extremity arterial disease.  Right toe pressure is >60 mmHg which suggests adequate perfusion for  healing.  Left: Resting left ankle-brachial index is within normal range.  Left toe pressure is >60 mmHg which suggests adequate perfusion for  healing.   Right Carotid Findings:  +----------+--------+--------+--------+------------------+--------+           PSV cm/sEDV cm/sStenosisPlaque DescriptionComments  +----------+--------+--------+--------+------------------+--------+  CCA Prox  86      0               heterogenous                +----------+--------+--------+--------+------------------+--------+  CCA Distal65      6               heterogenous                +----------+--------+--------+--------+------------------+--------+  ICA Prox  62      12      1-39%   heterogenous                +----------+--------+--------+--------+------------------+--------+  ICA Mid   76      13                                          +----------+--------+--------+--------+------------------+--------+  ICA Distal72       13                                          +----------+--------+--------+--------+------------------+--------+  ECA      67      0                                           +----------+--------+--------+--------+------------------+--------+   +----------+--------+-------+--------+-------------------+           PSV cm/sEDV cmsDescribeArm Pressure (mmHG)  +----------+--------+-------+--------+-------------------+  Dlarojcpjw14     0              167                  +----------+--------+-------+--------+-------------------+   +---------+--------+--+--------+-+---------+  VertebralPSV cm/s51EDV cm/s4Antegrade  +---------+--------+--+--------+-+---------+      Left Carotid Findings:  +----------+--------+--------+--------+-------------------------+--------+  PSV cm/sEDV cm/sStenosisPlaque Description       Comments  +----------+--------+--------+--------+-------------------------+--------+  CCA Prox  103     6               heterogenous                       +----------+--------+--------+--------+-------------------------+--------+  CCA Distal108     5               heterogenous                       +----------+--------+--------+--------+-------------------------+--------+  ICA Prox  71      9       1-39%   heterogenous and calcific          +----------+--------+--------+--------+-------------------------+--------+  ICA Mid   73      13                                                 +----------+--------+--------+--------+-------------------------+--------+  ICA Distal72      11                                                 +----------+--------+--------+--------+-------------------------+--------+  ECA      66      0                                                  +----------+--------+--------+--------+-------------------------+--------+    +----------+--------+--------+--------+-------------------+           PSV cm/sEDV cm/sDescribeArm Pressure (mmHG)  +----------+--------+--------+--------+-------------------+  Subclavian90     0               165                  +----------+--------+--------+--------+-------------------+   +---------+--------+--+--------+-+---------+  VertebralPSV cm/s59EDV cm/s6Antegrade  +---------+--------+--+--------+-+---------+         Summary:  Right Carotid: Velocities in the right ICA are consistent with a 1-39%  stenosis.   Left Carotid: Velocities in the left ICA are consistent with a 1-39%  stenosis.   Vertebrals: Bilateral vertebral arteries demonstrate antegrade flow.  Subclavians: Normal flow hemodynamics were seen in bilateral subclavian               arteries.        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  Pulses are palpable on the left.  He will not need carotid duplex repeated as these are mostly normal today.  Will follow him up in 4 to 6 weeks with abdominal aortic duplex to evaluate his aneurysm which is palpable on today's exam.  We discussed the signs and symptoms of rupture and all questions were answered he demonstrates good understanding.     Penne Lonni Colorado MD [facility]

## 2024-03-01 ENCOUNTER — Other Ambulatory Visit: Payer: Self-pay | Admitting: *Deleted

## 2024-03-01 DIAGNOSIS — I714 Abdominal aortic aneurysm, without rupture, unspecified: Secondary | ICD-10-CM

## 2024-03-08 DIAGNOSIS — J441 Chronic obstructive pulmonary disease with (acute) exacerbation: Secondary | ICD-10-CM | POA: Diagnosis not present

## 2024-04-11 ENCOUNTER — Encounter: Payer: Self-pay | Admitting: Vascular Surgery

## 2024-04-11 ENCOUNTER — Ambulatory Visit (HOSPITAL_COMMUNITY)
Admission: RE | Admit: 2024-04-11 | Discharge: 2024-04-11 | Disposition: A | Discharge status: Home / Self Care | Source: Ambulatory Visit | Attending: Vascular Surgery | Admitting: Vascular Surgery

## 2024-04-11 ENCOUNTER — Ambulatory Visit: Admitting: Vascular Surgery

## 2024-04-11 VITALS — BP 160/73 | HR 55 | Temp 98.3°F | Ht 69.0 in | Wt 136.0 lb

## 2024-04-11 DIAGNOSIS — I70211 Atherosclerosis of native arteries of extremities with intermittent claudication, right leg: Secondary | ICD-10-CM

## 2024-04-11 DIAGNOSIS — I714 Abdominal aortic aneurysm, without rupture, unspecified: Secondary | ICD-10-CM | POA: Diagnosis present

## 2024-04-11 NOTE — Progress Notes (Signed)
 Patient ID: Justin JAYSON Gwenith Mickey., male   DOB: June 13, 1933, 89 y.o.   MRN: [id number]  Reason for Consult: Follow-up   Referred by Claudene Pellet, MD  Subjective:     HPI:  Justin Conely. is a 89 y.o. 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  Claudication has resolved on the right.  He has known abdominal aortic aneurysm previously had palpable pulses on the left.  He is here today without complaints and continues on aspirin  therapy.  Past Medical History:  Diagnosis Date   AAA (abdominal aortic aneurysm)    Aortic aneurysm    Carotid artery occlusion    GERD (gastroesophageal reflux disease)    Hypercholesteremia    Hypertension    Family History  Problem Relation Age of Onset   Heart disease Mother    Heart attack Sister    Past Surgical History:  Procedure Laterality Date   ABDOMINAL AORTOGRAM N/A 09/26/2023   Procedure: ABDOMINAL AORTOGRAM;  Surgeon: Pearline Norman RAMAN, MD;  Location: [facility] INVASIVE CV LAB;  Service: Vascular;  Laterality: N/A;   ENDARTERECTOMY FEMORAL Right 10/06/2023   Procedure: ENDARTERECTOMY, FEMORAL;  Surgeon: Sheree Penne Bruckner, MD;  Location: [facility] OR;  Service: Vascular;  Laterality: Right;   HERNIA REPAIR     LOWER EXTREMITY ANGIOGRAPHY N/A 09/26/2023   Procedure: Lower Extremity Angiography;  Surgeon: Pearline Norman RAMAN, MD;  Location: [facility] INVASIVE CV LAB;  Service: Vascular;  Laterality: N/A;   LOWER EXTREMITY INTERVENTION N/A 09/26/2023   Procedure: LOWER EXTREMITY INTERVENTION;  Surgeon: Pearline Norman RAMAN, MD;  Location: [facility] INVASIVE CV LAB;  Service: Vascular;  Laterality: N/A;   ORTHOPEDIC SURGERY Left    wrist   PATCH ANGIOPLASTY Right 10/06/2023   Procedure: ANGIOPLASTY, USING PATCH GRAFT, USING XENOSURE SIZE 1CM X 6CM;  Surgeon: Sheree Penne Bruckner, MD;  Location: [facility] OR;  Service: Vascular;  Laterality: Right;     Short Social History:  Social History   Tobacco Use   Smoking status: Every Day    Current packs/day: 0.50    Types: Cigarettes    Passive exposure: Never   Smokeless tobacco: Current  Substance Use Topics   Alcohol use: Yes    Allergies  Allergen Reactions   Codeine     Hallucinations    Current Outpatient Medications  Medication Sig Dispense Refill   acetaminophen  (TYLENOL ) 500 MG tablet Take 1,000 mg by mouth every 6 (six) hours as needed for moderate pain (pain score 4-6).     ANORO ELLIPTA  62.5-25 MCG/ACT AEPB Inhale 1 puff into the lungs daily.     aspirin  EC 81 MG tablet Take 81 mg by mouth daily. Swallow whole.     losartan  (COZAAR ) 50 MG tablet Take 50 mg by mouth daily.     oxyCODONE -acetaminophen  (PERCOCET) 5-325 MG tablet Take 1 tablet by mouth every 6 (six) hours as needed for severe pain (pain score 7-10). 8 tablet 0   pantoprazole  (PROTONIX ) 40 MG tablet Take 40 mg by mouth daily as needed (heartburn).     simvastatin  (ZOCOR ) 40 MG tablet 1 tablet in the evening Orally Once a day for 100 days     No current facility-administered medications for this visit.    Review of Systems  Constitutional:  Constitutional negative. HENT: HENT negative.  Eyes: Eyes negative.  Respiratory: Respiratory negative.  Cardiovascular: Cardiovascular negative.  GI: Gastrointestinal negative.  Musculoskeletal: Musculoskeletal  negative.  Skin: Skin negative.  Neurological: Neurological negative. Hematologic: Hematologic/lymphatic negative.  Psychiatric: Psychiatric negative.        Objective:  Objective   Vitals:   04/11/24 0902  BP: (!) 160/73  Pulse: (!) 55  Temp: 98.3 F (36.8 C)  SpO2: 96%  Weight: 136 lb (61.7 kg)  Height: 5' 9 (1.753 m)   Body mass index is 20.08 kg/m.  Physical Exam HENT:     Head: Normocephalic.     Nose: Nose normal.  Eyes:     Pupils: Pupils are equal, round, and reactive to light.  Cardiovascular:     Rate and Rhythm:  Normal rate.     Pulses:          Popliteal pulses are 2+ on the left side.       Dorsalis pedis pulses are 0 on the right side.       Posterior tibial pulses are 0 on the right side and 2+ on the left side.  Pulmonary:     Effort: Pulmonary effort is normal.  Abdominal:     General: Abdomen is flat.  Musculoskeletal:        General: Normal range of motion.     Right lower leg: No edema.     Left lower leg: No edema.  Skin:    General: Skin is warm.     Capillary Refill: Capillary refill takes less than 2 seconds.  Neurological:     Mental Status: He is alert.     Data: Abdominal Aorta Findings:  +-----------+-------+----------+----------+----------+--------+--------+  Location  AP (cm)Trans (cm)PSV (cm/s)Waveform  ThrombusComments  +-----------+-------+----------+----------+----------+--------+--------+  Proximal  2.23   2.29      121       biphasic                    +-----------+-------+----------+----------+----------+--------+--------+  Mid       2.24   2.26      53        biphasic                    +-----------+-------+----------+----------+----------+--------+--------+  Distal    3.31   3.55      33        monophasicPresent           +-----------+-------+----------+----------+----------+--------+--------+  RT CIA Prox1.2    1.5       101       biphasic                    +-----------+-------+----------+----------+----------+--------+--------+  LT CIA Prox1.3    1.3       79        biphasic                    +-----------+-------+----------+----------+----------+--------+--------+   Visualization of the Proximal Abdominal Aorta, Right CIA Proximal artery  and Left CIA Proximal artery was limited.      Summary:  Abdominal Aorta: There is evidence of abnormal dilatation of the distal  Abdominal aorta. The largest aortic measurement is 3.6 cm. The largest  aortic diameter remains essentially unchanged compared to prior  exam.  Previous diameter measurement was 3.5 cm  obtained on 03/22/2022.      Assessment/Plan:     89 year old male with vascular history as above.  Claudication has resolved after right common femoral endarterectomy.  He has a known occluded right popliteal artery which is asymptomatic.  Left popliteal artery is patent  did not appear aneurysmal on most recent angiography but will obtain bilateral lower extremity duplexes at next visit.  His abdominal aortic small aneurysm can be followed in 2 to 3 years and we are not planning to follow carotid disease in the future given low stenosis on last evaluation unless he has symptoms.  All of his questions were answered he demonstrates good understanding    Penne Lonni Colorado MD [facility] [place]
